# Patient Record
Sex: Female | Born: 1960 | Race: White | Hispanic: No | Marital: Married | Smoking: Never smoker
Health system: Southern US, Community
[De-identification: ages and names within clinical notes are randomized; demographics above are authoritative.]

## PROBLEM LIST (undated history)

## (undated) DIAGNOSIS — D473 Essential (hemorrhagic) thrombocythemia: Secondary | ICD-10-CM

## (undated) DIAGNOSIS — G43909 Migraine, unspecified, not intractable, without status migrainosus: Secondary | ICD-10-CM

## (undated) DIAGNOSIS — B019 Varicella without complication: Secondary | ICD-10-CM

## (undated) DIAGNOSIS — E785 Hyperlipidemia, unspecified: Secondary | ICD-10-CM

## (undated) DIAGNOSIS — R011 Cardiac murmur, unspecified: Secondary | ICD-10-CM

## (undated) DIAGNOSIS — Z780 Asymptomatic menopausal state: Secondary | ICD-10-CM

## (undated) DIAGNOSIS — K635 Polyp of colon: Secondary | ICD-10-CM

## (undated) DIAGNOSIS — J45909 Unspecified asthma, uncomplicated: Secondary | ICD-10-CM

## (undated) DIAGNOSIS — M1990 Unspecified osteoarthritis, unspecified site: Secondary | ICD-10-CM

## (undated) DIAGNOSIS — G510 Bell's palsy: Secondary | ICD-10-CM

## (undated) DIAGNOSIS — T7840XA Allergy, unspecified, initial encounter: Secondary | ICD-10-CM

## (undated) DIAGNOSIS — Z124 Encounter for screening for malignant neoplasm of cervix: Secondary | ICD-10-CM

## (undated) DIAGNOSIS — Z862 Personal history of diseases of the blood and blood-forming organs and certain disorders involving the immune mechanism: Secondary | ICD-10-CM

## (undated) DIAGNOSIS — R739 Hyperglycemia, unspecified: Secondary | ICD-10-CM

## (undated) DIAGNOSIS — B029 Zoster without complications: Secondary | ICD-10-CM

## (undated) HISTORY — PX: OTHER SURGICAL HISTORY: SHX169

## (undated) HISTORY — DX: Migraine, unspecified, not intractable, without status migrainosus: G43.909

## (undated) HISTORY — DX: Bell's palsy: G51.0

## (undated) HISTORY — DX: Cardiac murmur, unspecified: R01.1

## (undated) HISTORY — DX: Unspecified asthma, uncomplicated: J45.909

## (undated) HISTORY — DX: Polyp of colon: K63.5

## (undated) HISTORY — DX: Varicella without complication: B01.9

## (undated) HISTORY — DX: Allergy, unspecified, initial encounter: T78.40XA

## (undated) HISTORY — DX: Personal history of diseases of the blood and blood-forming organs and certain disorders involving the immune mechanism: Z86.2

## (undated) HISTORY — PX: TONSILLECTOMY: SHX5217

## (undated) HISTORY — PX: POLYPECTOMY: SHX149

## (undated) HISTORY — DX: Hyperlipidemia, unspecified: E78.5

## (undated) HISTORY — DX: Encounter for screening for malignant neoplasm of cervix: Z12.4

## (undated) HISTORY — DX: Unspecified osteoarthritis, unspecified site: M19.90

## (undated) HISTORY — PX: COLONOSCOPY: SHX174

## (undated) HISTORY — DX: Asymptomatic menopausal state: Z78.0

## (undated) HISTORY — DX: Essential (hemorrhagic) thrombocythemia: D47.3

## (undated) HISTORY — DX: Zoster without complications: B02.9

## (undated) HISTORY — DX: Hyperglycemia, unspecified: R73.9

---

## 1987-05-15 HISTORY — PX: DILATION AND CURETTAGE OF UTERUS: SHX78

## 2005-05-14 DIAGNOSIS — Z780 Asymptomatic menopausal state: Secondary | ICD-10-CM

## 2005-05-14 HISTORY — DX: Asymptomatic menopausal state: Z78.0

## 2011-01-13 LAB — HM PAP SMEAR: HM Pap smear: NORMAL

## 2011-09-13 ENCOUNTER — Ambulatory Visit (INDEPENDENT_AMBULATORY_CARE_PROVIDER_SITE_OTHER): Payer: BC Managed Care – PPO | Admitting: Family Medicine

## 2011-09-13 ENCOUNTER — Encounter: Payer: Self-pay | Admitting: Family Medicine

## 2011-09-13 VITALS — BP 125/79 | HR 80 | Temp 99.0°F | Ht 62.5 in | Wt 170.8 lb

## 2011-09-13 DIAGNOSIS — Z0000 Encounter for general adult medical examination without abnormal findings: Secondary | ICD-10-CM | POA: Insufficient documentation

## 2011-09-13 DIAGNOSIS — E559 Vitamin D deficiency, unspecified: Secondary | ICD-10-CM

## 2011-09-13 DIAGNOSIS — E785 Hyperlipidemia, unspecified: Secondary | ICD-10-CM | POA: Insufficient documentation

## 2011-09-13 DIAGNOSIS — R739 Hyperglycemia, unspecified: Secondary | ICD-10-CM

## 2011-09-13 DIAGNOSIS — B029 Zoster without complications: Secondary | ICD-10-CM | POA: Insufficient documentation

## 2011-09-13 DIAGNOSIS — Z1211 Encounter for screening for malignant neoplasm of colon: Secondary | ICD-10-CM

## 2011-09-13 DIAGNOSIS — G510 Bell's palsy: Secondary | ICD-10-CM | POA: Insufficient documentation

## 2011-09-13 DIAGNOSIS — Z780 Asymptomatic menopausal state: Secondary | ICD-10-CM | POA: Insufficient documentation

## 2011-09-13 DIAGNOSIS — Z23 Encounter for immunization: Secondary | ICD-10-CM

## 2011-09-13 DIAGNOSIS — G43909 Migraine, unspecified, not intractable, without status migrainosus: Secondary | ICD-10-CM | POA: Insufficient documentation

## 2011-09-13 DIAGNOSIS — L0100 Impetigo, unspecified: Secondary | ICD-10-CM

## 2011-09-13 DIAGNOSIS — D179 Benign lipomatous neoplasm, unspecified: Secondary | ICD-10-CM | POA: Insufficient documentation

## 2011-09-13 DIAGNOSIS — T7840XA Allergy, unspecified, initial encounter: Secondary | ICD-10-CM | POA: Insufficient documentation

## 2011-09-13 DIAGNOSIS — R7309 Other abnormal glucose: Secondary | ICD-10-CM

## 2011-09-13 NOTE — Assessment & Plan Note (Signed)
Enlarging over many years and becoming increasingly uncomfortable. Consider referral to [facility] for further consideration

## 2011-09-13 NOTE — Assessment & Plan Note (Signed)
Avoid trans fats, start Megared, minimize red neat

## 2011-09-13 NOTE — Assessment & Plan Note (Addendum)
Request old records, given Tdap today, referred for colonoscopy

## 2011-09-13 NOTE — Patient Instructions (Addendum)
Preventive Care for Adults, Female A healthy lifestyle and preventive care can promote health and wellness. Preventive health guidelines for women include the following key practices.  A routine yearly physical is a good way to check with your caregiver about your health and preventive screening. It is a chance to share any concerns and updates on your health, and to receive a thorough exam.   Visit your dentist for a routine exam and preventive care every 6 months. Brush your teeth twice a day and floss once a day. Good oral hygiene prevents tooth decay and gum disease.   The frequency of eye exams is based on your age, health, family medical history, use of contact lenses, and other factors. Follow your caregiver's recommendations for frequency of eye exams.   Eat a healthy diet. Foods like vegetables, fruits, whole grains, low-fat dairy products, and lean protein foods contain the nutrients you need without too many calories. Decrease your intake of foods high in solid fats, added sugars, and salt. Eat the right amount of calories for you.Get information about a proper diet from your caregiver, if necessary.   Regular physical exercise is one of the most important things you can do for your health. Most adults should get at least 150 minutes of moderate-intensity exercise (any activity that increases your heart rate and causes you to sweat) each week. In addition, most adults need muscle-strengthening exercises on 2 or more days a week.   Maintain a healthy weight. The body mass index (BMI) is a screening tool to identify possible weight problems. It provides an estimate of body fat based on height and weight. Your caregiver can help determine your BMI, and can help you achieve or maintain a healthy weight.For adults 20 years and older:   A BMI below 18.5 is considered underweight.   A BMI of 18.5 to 24.9 is normal.   A BMI of 25 to 29.9 is considered overweight.   A BMI of 30 and above is  considered obese.   Maintain normal blood lipids and cholesterol levels by exercising and minimizing your intake of saturated fat. Eat a balanced diet with plenty of fruit and vegetables. Blood tests for lipids and cholesterol should begin at age 20 and be repeated every 5 years. If your lipid or cholesterol levels are high, you are over 50, or you are at high risk for heart disease, you may need your cholesterol levels checked more frequently.Ongoing high lipid and cholesterol levels should be treated with medicines if diet and exercise are not effective.   If you smoke, find out from your caregiver how to quit. If you do not use tobacco, do not start.   If you are pregnant, do not drink alcohol. If you are breastfeeding, be very cautious about drinking alcohol. If you are not pregnant and choose to drink alcohol, do not exceed 1 drink per day. One drink is considered to be 12 ounces (355 mL) of beer, 5 ounces (148 mL) of wine, or 1.5 ounces (44 mL) of liquor.   Avoid use of street drugs. Do not share needles with anyone. Ask for help if you need support or instructions about stopping the use of drugs.   High blood pressure causes heart disease and increases the risk of stroke. Your blood pressure should be checked at least every 1 to 2 years. Ongoing high blood pressure should be treated with medicines if weight loss and exercise are not effective.   If you are 55 to 51   years old, ask your caregiver if you should take aspirin to prevent strokes.   Diabetes screening involves taking a blood sample to check your fasting blood sugar level. This should be done once every 3 years, after age 45, if you are within normal weight and without risk factors for diabetes. Testing should be considered at a younger age or be carried out more frequently if you are overweight and have at least 1 risk factor for diabetes.   Breast cancer screening is essential preventive care for women. You should practice "breast  self-awareness." This means understanding the normal appearance and feel of your breasts and may include breast self-examination. Any changes detected, no matter how small, should be reported to a caregiver. Women in their 20s and 30s should have a clinical breast exam (CBE) by a caregiver as part of a regular health exam every 1 to 3 years. After age 40, women should have a CBE every year. Starting at age 40, women should consider having a mammography (breast X-ray test) every year. Women who have a family history of breast cancer should talk to their caregiver about genetic screening. Women at a high risk of breast cancer should talk to their caregivers about having magnetic resonance imaging (MRI) and a mammography every year.   The Pap test is a screening test for cervical cancer. A Pap test can show cell changes on the cervix that might become cervical cancer if left untreated. A Pap test is a procedure in which cells are obtained and examined from the lower end of the uterus (cervix).   Women should have a Pap test starting at age 21.   Between ages 21 and 29, Pap tests should be repeated every 2 years.   Beginning at age 30, you should have a Pap test every 3 years as long as the past 3 Pap tests have been normal.   Some women have medical problems that increase the chance of getting cervical cancer. Talk to your caregiver about these problems. It is especially important to talk to your caregiver if a new problem develops soon after your last Pap test. In these cases, your caregiver may recommend more frequent screening and Pap tests.   The above recommendations are the same for women who have or have not gotten the vaccine for human papillomavirus (HPV).   If you had a hysterectomy for a problem that was not cancer or a condition that could lead to cancer, then you no longer need Pap tests. Even if you no longer need a Pap test, a regular exam is a good idea to make sure no other problems are  starting.   If you are between ages 65 and 70, and you have had normal Pap tests going back 10 years, you no longer need Pap tests. Even if you no longer need a Pap test, a regular exam is a good idea to make sure no other problems are starting.   If you have had past treatment for cervical cancer or a condition that could lead to cancer, you need Pap tests and screening for cancer for at least 20 years after your treatment.   If Pap tests have been discontinued, risk factors (such as a new sexual partner) need to be reassessed to determine if screening should be resumed.   The HPV test is an additional test that may be used for cervical cancer screening. The HPV test looks for the virus that can cause the cell changes on the cervix.   The cells collected during the Pap test can be tested for HPV. The HPV test could be used to screen women aged 30 years and older, and should be used in women of any age who have unclear Pap test results. After the age of 30, women should have HPV testing at the same frequency as a Pap test.   Colorectal cancer can be detected and often prevented. Most routine colorectal cancer screening begins at the age of 50 and continues through age 75. However, your caregiver may recommend screening at an earlier age if you have risk factors for colon cancer. On a yearly basis, your caregiver may provide home test kits to check for hidden blood in the stool. Use of a small camera at the end of a tube, to directly examine the colon (sigmoidoscopy or colonoscopy), can detect the earliest forms of colorectal cancer. Talk to your caregiver about this at age 50, when routine screening begins. Direct examination of the colon should be repeated every 5 to 10 years through age 75, unless early forms of pre-cancerous polyps or small growths are found.   Hepatitis C blood testing is recommended for all people born from 1945 through 1965 and any individual with known risks for hepatitis C.    Practice safe sex. Use condoms and avoid high-risk sexual practices to reduce the spread of sexually transmitted infections (STIs). STIs include gonorrhea, chlamydia, syphilis, trichomonas, herpes, HPV, and human immunodeficiency virus (HIV). Herpes, HIV, and HPV are viral illnesses that have no cure. They can result in disability, cancer, and death. Sexually active women aged 25 and younger should be checked for chlamydia. Older women with new or multiple partners should also be tested for chlamydia. Testing for other STIs is recommended if you are sexually active and at increased risk.   Osteoporosis is a disease in which the bones lose minerals and strength with aging. This can result in serious bone fractures. The risk of osteoporosis can be identified using a bone density scan. Women ages 65 and over and women at risk for fractures or osteoporosis should discuss screening with their caregivers. Ask your caregiver whether you should take a calcium supplement or vitamin D to reduce the rate of osteoporosis.   Menopause can be associated with physical symptoms and risks. Hormone replacement therapy is available to decrease symptoms and risks. You should talk to your caregiver about whether hormone replacement therapy is right for you.   Use sunscreen with sun protection factor (SPF) of 30 or more. Apply sunscreen liberally and repeatedly throughout the day. You should seek shade when your shadow is shorter than you. Protect yourself by wearing long sleeves, pants, a wide-brimmed hat, and sunglasses year round, whenever you are outdoors.   Once a month, do a whole body skin exam, using a mirror to look at the skin on your back. Notify your caregiver of new moles, moles that have irregular borders, moles that are larger than a pencil eraser, or moles that have changed in shape or color.   Stay current with required immunizations.   Influenza. You need a dose every fall (or winter). The composition of  the flu vaccine changes each year, so being vaccinated once is not enough.   Pneumococcal polysaccharide. You need 1 to 2 doses if you smoke cigarettes or if you have certain chronic medical conditions. You need 1 dose at age 65 (or older) if you have never been vaccinated.   Tetanus, diphtheria, pertussis (Tdap, Td). Get 1 dose of   Tdap vaccine if you are younger than age 65, are over 65 and have contact with an infant, are a healthcare worker, are pregnant, or simply want to be protected from whooping cough. After that, you need a Td booster dose every 10 years. Consult your caregiver if you have not had at least 3 tetanus and diphtheria-containing shots sometime in your life or have a deep or dirty wound.   HPV. You need this vaccine if you are a woman age 26 or younger. The vaccine is given in 3 doses over 6 months.   Measles, mumps, rubella (MMR). You need at least 1 dose of MMR if you were born in 1957 or later. You may also need a second dose.   Meningococcal. If you are age 19 to 21 and a first-year college student living in a residence hall, or have one of several medical conditions, you need to get vaccinated against meningococcal disease. You may also need additional booster doses.   Zoster (shingles). If you are age 60 or older, you should get this vaccine.   Varicella (chickenpox). If you have never had chickenpox or you were vaccinated but received only 1 dose, talk to your caregiver to find out if you need this vaccine.   Hepatitis A. You need this vaccine if you have a specific risk factor for hepatitis A virus infection or you simply wish to be protected from this disease. The vaccine is usually given as 2 doses, 6 to 18 months apart.   Hepatitis B. You need this vaccine if you have a specific risk factor for hepatitis B virus infection or you simply wish to be protected from this disease. The vaccine is given in 3 doses, usually over 6 months.  Preventive Services /  Frequency Ages 19 to 39  Blood pressure check.** / Every 1 to 2 years.   Lipid and cholesterol check.** / Every 5 years beginning at age 20.   Clinical breast exam.** / Every 3 years for women in their 20s and 30s.   Pap test.** / Every 2 years from ages 21 through 29. Every 3 years starting at age 30 through age 65 or 70 with a history of 3 consecutive normal Pap tests.   HPV screening.** / Every 3 years from ages 30 through ages 65 to 70 with a history of 3 consecutive normal Pap tests.   Hepatitis C blood test.** / For any individual with known risks for hepatitis C.   Skin self-exam. / Monthly.   Influenza immunization.** / Every year.   Pneumococcal polysaccharide immunization.** / 1 to 2 doses if you smoke cigarettes or if you have certain chronic medical conditions.   Tetanus, diphtheria, pertussis (Tdap, Td) immunization. / A one-time dose of Tdap vaccine. After that, you need a Td booster dose every 10 years.   HPV immunization. / 3 doses over 6 months, if you are 26 and younger.   Measles, mumps, rubella (MMR) immunization. / You need at least 1 dose of MMR if you were born in 1957 or later. You may also need a second dose.   Meningococcal immunization. / 1 dose if you are age 19 to 21 and a first-year college student living in a residence hall, or have one of several medical conditions, you need to get vaccinated against meningococcal disease. You may also need additional booster doses.   Varicella immunization.** / Consult your caregiver.   Hepatitis A immunization.** / Consult your caregiver. 2 doses, 6 to 18 months   apart.   Hepatitis B immunization.** / Consult your caregiver. 3 doses usually over 6 months.  Ages 40 to 64  Blood pressure check.** / Every 1 to 2 years.   Lipid and cholesterol check.** / Every 5 years beginning at age 20.   Clinical breast exam.** / Every year after age 40.   Mammogram.** / Every year beginning at age 40 and continuing for as  long as you are in good health. Consult with your caregiver.   Pap test.** / Every 3 years starting at age 30 through age 65 or 70 with a history of 3 consecutive normal Pap tests.   HPV screening.** / Every 3 years from ages 30 through ages 65 to 70 with a history of 3 consecutive normal Pap tests.   Fecal occult blood test (FOBT) of stool. / Every year beginning at age 50 and continuing until age 75. You may not need to do this test if you get a colonoscopy every 10 years.   Flexible sigmoidoscopy or colonoscopy.** / Every 5 years for a flexible sigmoidoscopy or every 10 years for a colonoscopy beginning at age 50 and continuing until age 75.   Hepatitis C blood test.** / For all people born from 1945 through 1965 and any individual with known risks for hepatitis C.   Skin self-exam. / Monthly.   Influenza immunization.** / Every year.   Pneumococcal polysaccharide immunization.** / 1 to 2 doses if you smoke cigarettes or if you have certain chronic medical conditions.   Tetanus, diphtheria, pertussis (Tdap, Td) immunization.** / A one-time dose of Tdap vaccine. After that, you need a Td booster dose every 10 years.   Measles, mumps, rubella (MMR) immunization. / You need at least 1 dose of MMR if you were born in 1957 or later. You may also need a second dose.   Varicella immunization.** / Consult your caregiver.   Meningococcal immunization.** / Consult your caregiver.   Hepatitis A immunization.** / Consult your caregiver. 2 doses, 6 to 18 months apart.   Hepatitis B immunization.** / Consult your caregiver. 3 doses, usually over 6 months.  Ages 65 and over  Blood pressure check.** / Every 1 to 2 years.   Lipid and cholesterol check.** / Every 5 years beginning at age 20.   Clinical breast exam.** / Every year after age 40.   Mammogram.** / Every year beginning at age 40 and continuing for as long as you are in good health. Consult with your caregiver.   Pap test.** /  Every 3 years starting at age 30 through age 65 or 70 with a 3 consecutive normal Pap tests. Testing can be stopped between 65 and 70 with 3 consecutive normal Pap tests and no abnormal Pap or HPV tests in the past 10 years.   HPV screening.** / Every 3 years from ages 30 through ages 65 or 70 with a history of 3 consecutive normal Pap tests. Testing can be stopped between 65 and 70 with 3 consecutive normal Pap tests and no abnormal Pap or HPV tests in the past 10 years.   Fecal occult blood test (FOBT) of stool. / Every year beginning at age 50 and continuing until age 75. You may not need to do this test if you get a colonoscopy every 10 years.   Flexible sigmoidoscopy or colonoscopy.** / Every 5 years for a flexible sigmoidoscopy or every 10 years for a colonoscopy beginning at age 50 and continuing until age 75.   Hepatitis   C blood test.** / For all people born from [birth year] through 1965 and any individual with known risks for hepatitis C.   Osteoporosis screening.** / A one-time screening for women ages 2 and over and women at risk for fractures or osteoporosis.   Skin self-exam. / Monthly.   Influenza immunization.** / Every year.   Pneumococcal polysaccharide immunization.** / 1 dose at age 60 (or older) if you have never been vaccinated.   Tetanus, diphtheria, pertussis (Tdap, Td) immunization. / A one-time dose of Tdap vaccine if you are over 65 and have contact with an infant, are a Research scientist (physical sciences), or simply want to be protected from whooping cough. After that, you need a Td booster dose every 10 years.   Varicella immunization.** / Consult your caregiver.   Meningococcal immunization.** / Consult your caregiver.   Hepatitis A immunization.** / Consult your caregiver. 2 doses, 6 to 18 months apart.   Hepatitis B immunization.** / Check with your caregiver. 3 doses, usually over 6 months.  ** Family history and personal history of risk and conditions may change your caregiver's  recommendations. Document Released: 06/26/2001 Document Revised: 04/19/2011 Document Reviewed: 09/25/2010 [facility] Patient Information 2012 [place], [place].   Start a megaRed cap daily  Hydrate well [employer]'s night time leg cramp med

## 2011-09-13 NOTE — Assessment & Plan Note (Signed)
Had trouble in her teen years and has had trouble intermittently but is now doing well, He will need further work up if any values

## 2011-09-13 NOTE — Assessment & Plan Note (Signed)
Will repeat levels to try and determine if she needs treatment

## 2011-09-13 NOTE — Assessment & Plan Note (Signed)
Has been told this in the past patient agrees to return for labs in near future

## 2011-09-13 NOTE — Assessment & Plan Note (Addendum)
Occurs on chin, none today

## 2011-09-13 NOTE — Progress Notes (Signed)
Patient ID: Sarah Velazquez, female   DOB: 29-Apr-1961, 51 y.o.   MRN: [id number] Shataria Crist [id number] 1961/02/23 09/13/2011      Progress Note-Follow Up  Subjective  Chief Complaint  Chief Complaint  Patient presents with  . Establish Care    new patient    HPI  Patient is a 51 year old Caucasian female who is in today for new patient appointment. In the past she has taken Crestor for hyperlipidemia but never had her cholesterol rechecked and so stopped it. She notes until age 51 from age of roughly to the pediatrician the time gave her gammaglobulin shots each week due to her history of recurrent bronchitis and hospitalization as a young child. As a child she had low-grade allergies has not had any significant wheezing or low-grade injury. No fevers, chills, malaise, myalgias, chest pain, palpitations, shortness of breath. Is requesting a referral for colonoscopy, no recent change in bowel habits.  Moves bowels comfortably. Does note ever since the birth of her 51 year old child which resulted in very painful and extensive episiotomy and repair, she has had intermittent episodes of rectal bleeding. She says they don't usually come with straining and they occur randomly. Less than once every 6 months. Blood is bright red and usually on the tissue very rarely and full. No abdominal pain nausea or anorexia  Past Medical History  Diagnosis Date  . Chicken pox as a child  . Mumps as a child  . Allergy     seasonal, dogs, cats, horses, hay  . Hyperlipidemia   . Bell's palsy in high school    right  . Shingles     right cheek  . Impetigo as a kid    right face  . Menopause 2007  . Migraines   . Vitamin d deficiency   . Hyperglycemia   . Lipoma     growing slowly since 20s    Past Surgical History  Procedure Date  . Cyst removed     twice on back and 1 on foot  . Cesarean section 05-10-87  . Tonsillectomy as a child  . Dilation and curettage of uterus 1989    incomplete miscarriage with  second pregnancy and again post menopausal    Family History  Problem Relation Age of Onset  . Stroke Mother     X 3  . Heart attack Mother   . Diabetes Mother     type 2  . Anxiety disorder Mother   . Hypertension Mother   . Hyperlipidemia Mother   . Heart disease Mother   . Cancer Mother     breast, skin  . Colon polyps Mother   . Stroke Father     several  . Heart attack Father     several  . Kidney disease Father     on dialysis  . Hypertension Father   . Hyperlipidemia Father   . Cancer Father     mouth X 2/ chewed tobacco, and skin  . Anxiety disorder Father   . Colon polyps Father   . Hypertension Brother   . Hyperlipidemia Brother   . Depression Brother   . Cancer Maternal Grandmother     breast  . Diabetes Maternal Grandmother     type 2  . Obesity Maternal Grandmother   . Cancer Paternal Grandmother     lung/ didn't smoke  . Heart attack Paternal Grandfather     History   Social History  . Marital Status: Married    Spouse  Name: N/A    Number of Children: N/A  . Years of Education: N/A   Occupational History  . Not on file.   Social History Main Topics  . Smoking status: Never Smoker   . Smokeless tobacco: Never Used  . Alcohol Use: Yes     occasionally  . Drug Use: No  . Sexually Active: Yes   Other Topics Concern  . Not on file   Social History Narrative  . No narrative on file    No current outpatient prescriptions on file prior to visit.    Allergies  Allergen Reactions  . Sulfa Antibiotics     Review of Systems  Review of Systems  Constitutional: Negative for fever and malaise/fatigue.  HENT: Negative for congestion.   Eyes: Negative for discharge.  Respiratory: Negative for shortness of breath.   Cardiovascular: Negative for chest pain, palpitations and leg swelling.  Gastrointestinal: Negative for heartburn, nausea, abdominal pain and diarrhea.  Genitourinary: Negative for dysuria.  Musculoskeletal: Negative for  falls.  Skin: Negative for rash.  Neurological: Negative for loss of consciousness and headaches.  Endo/Heme/Allergies: Negative for polydipsia.  Psychiatric/Behavioral: Negative for depression and suicidal ideas. The patient is not nervous/anxious and does not have insomnia.     Objective  BP 125/79  Pulse 80  Temp(Src) 99 F (37.2 C) (Temporal)  Ht 5' 2.5" (1.588 m)  Wt 170 lb 12.8 oz (77.474 kg)  BMI 30.74 kg/m2  SpO2 95%  Physical Physical Exam  Constitutional: She is oriented to person, place, and time and well-developed, well-nourished, and in no distress. No distress.  HENT:  Head: Normocephalic and atraumatic.  Right Ear: External ear normal.  Left Ear: External ear normal.  Nose: Nose normal.  Mouth/Throat: Oropharynx is clear and moist. No oropharyngeal exudate.  Eyes: Conjunctivae are normal. Pupils are equal, round, and reactive to light. Right eye exhibits no discharge. Left eye exhibits no discharge. No scleral icterus.  Neck: Normal range of motion. Neck supple. No thyromegaly present.  Cardiovascular: Normal rate, regular rhythm, normal heart sounds and intact distal pulses.   No murmur heard. Pulmonary/Chest: Effort normal and breath sounds normal. No respiratory distress. She has no wheezes. She has no rales.  Abdominal: Soft. Bowel sounds are normal. She exhibits no distension and no mass. There is no tenderness.  Musculoskeletal: Normal range of motion. She exhibits no edema and no tenderness.  Lymphadenopathy:    She has no cervical adenopathy.  Neurological: She is alert and oriented to person, place, and time. She has normal reflexes. No cranial nerve deficit. Coordination normal.  Skin: Skin is warm and dry. No rash noted. She is not diaphoretic.  Psychiatric: Mood, memory and affect normal.      Assessment & Plan   Lipoma Enlarging over many years and becoming increasingly uncomfortable. Consider referral to [facility] for further  consideration  Hyperglycemia Has been told this in the past patient agrees to return for labs in near future  Hyperlipidemia Avoid trans fats, start Megared, minimize red neat  Bell's palsy Had trouble in her teen years and has had trouble intermittently but is now doing well, He will need further work up if any values   Impetigo Occurs on chin, none today  Preventative health care Request old records, given Tdap today, referred for colonoscopy  Vitamin d deficiency Will repeat levels to try and determine if she needs treatment

## 2011-09-14 ENCOUNTER — Encounter: Payer: Self-pay | Admitting: Gastroenterology

## 2011-09-28 ENCOUNTER — Other Ambulatory Visit (INDEPENDENT_AMBULATORY_CARE_PROVIDER_SITE_OTHER): Payer: BC Managed Care – PPO

## 2011-09-28 DIAGNOSIS — E785 Hyperlipidemia, unspecified: Secondary | ICD-10-CM

## 2011-09-28 DIAGNOSIS — R739 Hyperglycemia, unspecified: Secondary | ICD-10-CM

## 2011-09-28 DIAGNOSIS — Z0000 Encounter for general adult medical examination without abnormal findings: Secondary | ICD-10-CM

## 2011-09-28 DIAGNOSIS — R7309 Other abnormal glucose: Secondary | ICD-10-CM

## 2011-09-28 LAB — CBC
HCT: 37.8 % (ref 36.0–46.0)
MCHC: 33.1 g/dL (ref 30.0–36.0)
MCV: 88 fl (ref 78.0–100.0)
RBC: 4.3 Mil/uL (ref 3.87–5.11)

## 2011-09-28 LAB — LIPID PANEL
Cholesterol: 251 mg/dL — ABNORMAL HIGH (ref 0–200)
HDL: 37.4 mg/dL — ABNORMAL LOW (ref >39.00)
Total CHOL/HDL Ratio: 7
Triglycerides: 266 mg/dL — ABNORMAL HIGH (ref 0.0–149.0)
VLDL: 53.2 mg/dL — ABNORMAL HIGH (ref 0.0–40.0)

## 2011-09-28 LAB — RENAL FUNCTION PANEL
Albumin: 4.2 g/dL (ref 3.5–5.2)
BUN: 15 mg/dL (ref 6–23)
Calcium: 9.4 mg/dL (ref 8.4–10.5)
Creatinine, Ser: 0.7 mg/dL (ref 0.4–1.2)
Glucose, Bld: 91 mg/dL (ref 70–99)
Phosphorus: 3.7 mg/dL (ref 2.3–4.6)

## 2011-09-28 LAB — HEPATIC FUNCTION PANEL
ALT: 20 U/L (ref 0–35)
Bilirubin, Direct: 0 mg/dL (ref 0.0–0.3)
Total Bilirubin: 0.5 mg/dL (ref 0.3–1.2)

## 2011-09-28 LAB — LDL CHOLESTEROL, DIRECT: Direct LDL: 170.5 mg/dL

## 2011-09-29 LAB — VITAMIN D 25 HYDROXY (VIT D DEFICIENCY, FRACTURES): Vit D, 25-Hydroxy: 37 ng/mL (ref 30–89)

## 2011-10-03 ENCOUNTER — Ambulatory Visit (AMBULATORY_SURGERY_CENTER): Payer: BC Managed Care – PPO | Admitting: *Deleted

## 2011-10-03 VITALS — Ht 62.0 in | Wt 168.7 lb

## 2011-10-03 DIAGNOSIS — Z1211 Encounter for screening for malignant neoplasm of colon: Secondary | ICD-10-CM

## 2011-10-03 MED ORDER — MOVIPREP 100 G PO SOLR: ORAL | Status: DC

## 2011-10-04 ENCOUNTER — Encounter: Payer: Self-pay | Admitting: Gastroenterology

## 2011-10-17 ENCOUNTER — Encounter: Payer: BC Managed Care – PPO | Admitting: Gastroenterology

## 2011-10-29 ENCOUNTER — Ambulatory Visit (AMBULATORY_SURGERY_CENTER): Payer: BC Managed Care – PPO | Admitting: Gastroenterology

## 2011-10-29 ENCOUNTER — Encounter: Payer: Self-pay | Admitting: Gastroenterology

## 2011-10-29 VITALS — BP 130/71 | HR 83 | Temp 95.6°F | Resp 16 | Ht 62.0 in | Wt 168.0 lb

## 2011-10-29 DIAGNOSIS — D126 Benign neoplasm of colon, unspecified: Secondary | ICD-10-CM

## 2011-10-29 DIAGNOSIS — Z1211 Encounter for screening for malignant neoplasm of colon: Secondary | ICD-10-CM

## 2011-10-29 MED ORDER — SODIUM CHLORIDE 0.9 % IV SOLN: 500.0000 mL | INTRAVENOUS | Status: DC

## 2011-10-29 NOTE — Patient Instructions (Addendum)
YOU HAD AN ENDOSCOPIC PROCEDURE TODAY AT THE [facility]: Refer to the procedure report that was given to you for any specific questions about what was found during the examination.  If the procedure report does not answer your questions, please call your gastroenterologist to clarify.  If you requested that your care partner not be given the details of your procedure findings, then the procedure report has been included in a sealed envelope for you to review at your convenience later.  YOU SHOULD EXPECT: Some feelings of bloating in the abdomen. Passage of more gas than usual.  Walking can help get rid of the air that was put into your GI tract during the procedure and reduce the bloating. If you had a lower endoscopy (such as a colonoscopy or flexible sigmoidoscopy) you may notice spotting of blood in your stool or on the toilet paper. If you underwent a bowel prep for your procedure, then you may not have a normal bowel movement for a few days.  DIET: Your first meal following the procedure should be a light meal and then it is ok to progress to your normal diet.  A half-sandwich or bowl of soup is an example of a good first meal.  Heavy or fried foods are harder to digest and may make you feel nauseous or bloated.  Likewise meals heavy in dairy and vegetables can cause extra gas to form and this can also increase the bloating.  Drink plenty of fluids but you should avoid alcoholic beverages for 24 hours.  ACTIVITY: Your care partner should take you home directly after the procedure.  You should plan to take it easy, moving slowly for the rest of the day.  You can resume normal activity the day after the procedure however you should NOT DRIVE or use heavy machinery for 24 hours (because of the sedation medicines used during the test).    SYMPTOMS TO REPORT IMMEDIATELY: A gastroenterologist can be reached at any hour.  During normal business hours, 8:30 AM to 5:00 PM Monday through Friday,  call [phone].  After hours and on weekends, please call the GI answering service at [phone] who will take a message and have the physician on call contact you.   Following lower endoscopy (colonoscopy or flexible sigmoidoscopy):  Excessive amounts of blood in the stool  Significant tenderness or worsening of abdominal pains  Swelling of the abdomen that is new, acute  Fever of 100F or higher    FOLLOW UP: If any biopsies were taken you will be contacted by phone or by letter within the next 1-3 weeks.  Call your gastroenterologist if you have not heard about the biopsies in 3 weeks.  Our staff will call the home number listed on your records the next business day following your procedure to check on you and address any questions or concerns that you may have at that time regarding the information given to you following your procedure. This is a courtesy call and so if there is no answer at the home number and we have not heard from you through the emergency physician on call, we will assume that you have returned to your regular daily activities without incident.  SIGNATURES/CONFIDENTIALITY: You and/or your care partner have signed paperwork which will be entered into your electronic medical record.  These signatures attest to the fact that that the information above on your After Visit Summary has been reviewed and is understood.  Full responsibility of the confidentiality   of this discharge information lies with you and/or your care-partner.     

## 2011-10-29 NOTE — Op Note (Signed)
[facility] [address] [place], [place]  [address]  COLONOSCOPY PROCEDURE REPORT  PATIENT:  Sarah Velazquez, Sarah Velazquez  MR#:  [id number] BIRTHDATE:  09-05-1960, 50 yrs. old  GENDER:  female ENDOSCOPIST:  Vania Rea. Jarold Motto, MD, [facility] REF. BY:  Reuel Derby, M.D. PROCEDURE DATE:  10/29/2011 PROCEDURE:  Colonoscopy with snare polypectomy ASA CLASS:  Class II INDICATIONS:  family Hx of polyps MEDICATIONS:   propofol (Diprivan) 350 mg IV  DESCRIPTION OF PROCEDURE:   After the risks and benefits and of the procedure were explained, informed consent was obtained. Digital rectal exam was performed and revealed no abnormalities. The LB CF-[id number] endoscope was introduced through the anus and advanced to the cecum, which was identified by both the appendix and ileocecal valve.  The quality of the prep was excellent, using MoviPrep.  The instrument was then slowly withdrawn as the colon was fully examined. <<PROCEDUREIMAGES>>  FINDINGS:  A sessile polyp was found at the splenic flexure. A 1.2 CM SESSILE FLAT POLYP HOT SNARE REMOVED AT SPLEENIC FLEXURE AREA.SEE PICTURES.  This was otherwise a normal examination of the colon.   Retroflexed views in the rectum revealed no abnormalities.    The scope was then withdrawn from the patient and the procedure completed.  COMPLICATIONS:  None ENDOSCOPIC IMPRESSION: 1) Sessile polyp at the splenic flexure 2) Otherwise normal examination R/O ADENOMA. RECOMMENDATIONS: 1) Await pathology results 2) Repeat colonoscopy in 5 years if polyp adenomatous; otherwise 10 years  REPEAT EXAM:  No  ______________________________ Vania Rea. Jarold Motto, MD, [facility]  CC:  n. eSIGNED:   Vania Rea. Deadra Diggins at 10/29/2011 03:41 PM  Earley Abide, [phone]

## 2011-10-29 NOTE — Progress Notes (Signed)
Patient did not experience any of the following events: a burn prior to discharge; a fall within the facility; wrong site/side/patient/procedure/implant event; or a hospital transfer or hospital admission upon discharge from the facility. (G8907) Patient did not have preoperative order for IV antibiotic SSI prophylaxis. (G8918)  

## 2011-10-30 ENCOUNTER — Telehealth: Payer: Self-pay | Admitting: *Deleted

## 2011-10-30 NOTE — Telephone Encounter (Signed)
  Follow up Call-  Call back number 10/29/2011  Post procedure Call Back phone  # [phone]  Permission to leave phone message Yes     Patient questions:  Do you have a fever, pain , or abdominal swelling? no Pain Score  0 *  Have you tolerated food without any problems? yes  Have you been able to return to your normal activities? yes  Do you have any questions about your discharge instructions: Diet   no Medications  no Follow up visit  no  Do you have questions or concerns about your Care? no  Actions: * If pain score is 4 or above: No action needed, pain <4.

## 2011-11-02 ENCOUNTER — Encounter: Payer: Self-pay | Admitting: Gastroenterology

## 2012-04-01 ENCOUNTER — Other Ambulatory Visit: Payer: Self-pay | Admitting: Family Medicine

## 2012-04-01 DIAGNOSIS — E559 Vitamin D deficiency, unspecified: Secondary | ICD-10-CM

## 2012-04-01 DIAGNOSIS — R51 Headache: Secondary | ICD-10-CM

## 2012-04-01 DIAGNOSIS — E785 Hyperlipidemia, unspecified: Secondary | ICD-10-CM

## 2012-04-02 ENCOUNTER — Other Ambulatory Visit (INDEPENDENT_AMBULATORY_CARE_PROVIDER_SITE_OTHER): Payer: BC Managed Care – PPO

## 2012-04-02 DIAGNOSIS — E559 Vitamin D deficiency, unspecified: Secondary | ICD-10-CM

## 2012-04-02 DIAGNOSIS — E785 Hyperlipidemia, unspecified: Secondary | ICD-10-CM

## 2012-04-02 DIAGNOSIS — R51 Headache: Secondary | ICD-10-CM

## 2012-04-02 LAB — RENAL FUNCTION PANEL
Albumin: 4.5 g/dL (ref 3.5–5.2)
BUN: 16 mg/dL (ref 6–23)
CO2: 27 mEq/L (ref 19–32)
Chloride: 104 mEq/L (ref 96–112)
Phosphorus: 4.2 mg/dL (ref 2.3–4.6)

## 2012-04-02 LAB — CBC
HCT: 41.3 % (ref 36.0–46.0)
Hemoglobin: 13.5 g/dL (ref 12.0–15.0)
RDW: 13 % (ref 11.5–14.6)
WBC: 5.7 10*3/uL (ref 4.5–10.5)

## 2012-04-02 LAB — HEPATIC FUNCTION PANEL
AST: 19 U/L (ref 0–37)
Albumin: 4.5 g/dL (ref 3.5–5.2)

## 2012-04-02 LAB — LIPID PANEL
Cholesterol: 273 mg/dL — ABNORMAL HIGH (ref 0–200)
Triglycerides: 313 mg/dL — ABNORMAL HIGH (ref 0.0–149.0)
VLDL: 62.6 mg/dL — ABNORMAL HIGH (ref 0.0–40.0)

## 2012-04-08 ENCOUNTER — Ambulatory Visit (INDEPENDENT_AMBULATORY_CARE_PROVIDER_SITE_OTHER): Payer: BC Managed Care – PPO | Admitting: Family Medicine

## 2012-04-08 ENCOUNTER — Other Ambulatory Visit (HOSPITAL_COMMUNITY)
Admission: RE | Admit: 2012-04-08 | Discharge: 2012-04-08 | Disposition: A | Discharge status: Home / Self Care | Payer: BC Managed Care – PPO | Source: Ambulatory Visit | Attending: Family Medicine | Admitting: Family Medicine

## 2012-04-08 ENCOUNTER — Encounter: Payer: Self-pay | Admitting: Family Medicine

## 2012-04-08 VITALS — BP 122/78 | HR 67 | Temp 98.4°F | Ht 62.0 in | Wt 167.4 lb

## 2012-04-08 DIAGNOSIS — Z124 Encounter for screening for malignant neoplasm of cervix: Secondary | ICD-10-CM | POA: Insufficient documentation

## 2012-04-08 DIAGNOSIS — D126 Benign neoplasm of colon, unspecified: Secondary | ICD-10-CM

## 2012-04-08 DIAGNOSIS — D473 Essential (hemorrhagic) thrombocythemia: Secondary | ICD-10-CM

## 2012-04-08 DIAGNOSIS — K635 Polyp of colon: Secondary | ICD-10-CM

## 2012-04-08 DIAGNOSIS — Z0000 Encounter for general adult medical examination without abnormal findings: Secondary | ICD-10-CM

## 2012-04-08 DIAGNOSIS — Z01419 Encounter for gynecological examination (general) (routine) without abnormal findings: Secondary | ICD-10-CM | POA: Insufficient documentation

## 2012-04-08 DIAGNOSIS — E785 Hyperlipidemia, unspecified: Secondary | ICD-10-CM

## 2012-04-08 DIAGNOSIS — E559 Vitamin D deficiency, unspecified: Secondary | ICD-10-CM

## 2012-04-08 HISTORY — DX: Encounter for screening for malignant neoplasm of cervix: Z12.4

## 2012-04-08 NOTE — Assessment & Plan Note (Addendum)
Pap smear done today. No concerns identified on exam. Order given for [facility] and bone densitometry, she has historically done these tests at [facility] so she is given a printed copy of the orders to carry with her.

## 2012-04-08 NOTE — Patient Instructions (Addendum)

## 2012-04-09 ENCOUNTER — Encounter: Payer: Self-pay | Admitting: Family Medicine

## 2012-04-09 DIAGNOSIS — K635 Polyp of colon: Secondary | ICD-10-CM

## 2012-04-09 DIAGNOSIS — D75839 Thrombocytosis, unspecified: Secondary | ICD-10-CM

## 2012-04-09 DIAGNOSIS — D473 Essential (hemorrhagic) thrombocythemia: Secondary | ICD-10-CM | POA: Insufficient documentation

## 2012-04-09 HISTORY — DX: Polyp of colon: K63.5

## 2012-04-09 HISTORY — DX: Thrombocytosis, unspecified: D75.839

## 2012-04-09 NOTE — Assessment & Plan Note (Signed)
Numbers have worsened. She is not eating or drinking appropriately. Has stopped exercising and stopped taking her may arise well. She agrees to restart Omega red and she increased her exercise minimize saturated fats and carbs and avoid. Reassess in 3-4 months. May need to consider medications at that time. Encouraged to consider a vitamin B complex as well.

## 2012-04-09 NOTE — Assessment & Plan Note (Signed)
Levels within normal now

## 2012-04-09 NOTE — Assessment & Plan Note (Signed)
One found, recommended repeat colonoscopy in 5 years.

## 2012-04-09 NOTE — Assessment & Plan Note (Signed)
Very slight, new, likely secondary to dehydration.

## 2012-04-09 NOTE — Progress Notes (Signed)
Patient ID: Sarah Velazquez, female   DOB: Sep 20, 1960, 51 y.o.   MRN: [id number] Sarah Velazquez [id number] Aug 29, 1960 04/09/2012      Progress Note-Follow Up  Subjective  Chief Complaint  Chief Complaint  Patient presents with  . Gynecologic Exam    pap    HPI  Patient is a 51 year old Caucasian female who is in today for Pap smear and followup. Overall she's feeling well. She is frustrated about increasing her cholesterol. She technologist that her for a mother who's been sick for the last 3 months and as result she stopped exercising, is not eating right and is now taking her supplements. No other acute complaints. No recent illness, fevers, headaches, chest pain, palpitations, shortness of breath, GI or GU concerns. She denies any vaginal discharge lesions or concerns. Last period was December of 2012. She did have her colonoscopy in June of 2018 and 1 hyperplastic polyp was found. No constipation or concerns are noted.  Past Medical History  Diagnosis Date  . Chicken pox as a child  . Mumps as a child  . Allergy     seasonal, dogs, cats, horses, hay  . Hyperlipidemia   . Bell's palsy in high school    right  . Shingles     right cheek  . Impetigo as a kid    right face  . Menopause 2007  . Migraines   . Vitamin D deficiency   . Hyperglycemia   . Lipoma     growing slowly since 20s  . Preventative health care 09/13/2011  . Cervical cancer screening 04/08/2012  . Thrombocytosis 04/09/2012  . Hyperplastic colon polyp 04/09/2012    Past Surgical History  Procedure Date  . Cyst removed     twice on back and 1 on foot  . Cesarean section 05-10-87  . Tonsillectomy as a child  . Dilation and curettage of uterus 1989    incomplete miscarriage with second pregnancy and again post menopausal    Family History  Problem Relation Age of Onset  . Stroke Mother     X 3  . Heart attack Mother   . Diabetes Mother     type 2  . Anxiety disorder Mother   . Hypertension Mother   .  Hyperlipidemia Mother   . Heart disease Mother   . Cancer Mother     breast, skin  . Colon polyps Mother   . Stroke Father     several  . Heart attack Father     several  . Kidney disease Father     on dialysis  . Hypertension Father   . Hyperlipidemia Father   . Cancer Father     mouth X 2/ chewed tobacco, and skin  . Anxiety disorder Father   . Colon polyps Father   . Hypertension Brother   . Hyperlipidemia Brother   . Depression Brother   . Cancer Maternal Grandmother     breast  . Diabetes Maternal Grandmother     type 2  . Obesity Maternal Grandmother   . Cancer Paternal Grandmother     lung/ didn't smoke  . Heart attack Paternal Grandfather     History   Social History  . Marital Status: Married    Spouse Name: N/A    Number of Children: N/A  . Years of Education: N/A   Occupational History  . Not on file.   Social History Main Topics  . Smoking status: Never Smoker   . Smokeless tobacco: Never  Used  . Alcohol Use: 1.8 oz/week    3 Glasses of wine per week     Comment: occasionally  . Drug Use: No  . Sexually Active: Yes   Other Topics Concern  . Not on file   Social History Narrative  . No narrative on file    No current outpatient prescriptions on file prior to visit.    Allergies  Allergen Reactions  . Sulfa Antibiotics     Review of Systems  Review of Systems  Constitutional: Negative for fever and malaise/fatigue.  HENT: Negative for congestion.   Eyes: Negative for discharge.  Respiratory: Negative for shortness of breath.   Cardiovascular: Negative for chest pain, palpitations and leg swelling.  Gastrointestinal: Negative for nausea, abdominal pain and diarrhea.  Genitourinary: Negative for dysuria.  Musculoskeletal: Negative for falls.  Skin: Negative for rash.  Neurological: Negative for loss of consciousness and headaches.  Endo/Heme/Allergies: Negative for polydipsia.  Psychiatric/Behavioral: Negative for depression and  suicidal ideas. The patient is not nervous/anxious and does not have insomnia.     Objective  BP 122/78  Pulse 67  Temp 98.4 F (36.9 C) (Temporal)  Ht 5\' 2"  (1.575 m)  Wt 167 lb 6.4 oz (75.932 kg)  BMI 30.62 kg/m2  SpO2 99%  Physical Exam  Physical Exam  Constitutional: She is oriented to person, place, and time and well-developed, well-nourished, and in no distress. No distress.  HENT:  Head: Normocephalic and atraumatic.  Right Ear: External ear normal.  Left Ear: External ear normal.  Nose: Nose normal.  Mouth/Throat: Oropharynx is clear and moist. No oropharyngeal exudate.  Eyes: Conjunctivae normal are normal. Pupils are equal, round, and reactive to light. Right eye exhibits no discharge. Left eye exhibits no discharge. No scleral icterus.  Neck: Normal range of motion. Neck supple. No thyromegaly present.  Cardiovascular: Normal rate, regular rhythm, normal heart sounds and intact distal pulses.   No murmur heard. Pulmonary/Chest: Effort normal and breath sounds normal. No respiratory distress. She has no wheezes. She has no rales.  Abdominal: Soft. Bowel sounds are normal. She exhibits no distension and no mass. There is no tenderness.  Genitourinary: Vagina normal, uterus normal, cervix normal, right adnexa normal and left adnexa normal. No vaginal discharge found.  Musculoskeletal: Normal range of motion. She exhibits no edema and no tenderness.  Lymphadenopathy:    She has no cervical adenopathy.  Neurological: She is alert and oriented to person, place, and time. She has normal reflexes. No cranial nerve deficit. Coordination normal.  Skin: Skin is warm and dry. No rash noted. She is not diaphoretic.  Psychiatric: Mood, memory and affect normal.    Lab Results  Component Value Date   TSH 1.63 04/02/2012   Lab Results  Component Value Date   WBC 5.7 04/02/2012   HGB 13.5 04/02/2012   HCT 41.3 04/02/2012   MCV 87.4 04/02/2012   PLT 408.0* 04/02/2012   Lab  Results  Component Value Date   CREATININE 0.8 04/02/2012   BUN 16 04/02/2012   NA 139 04/02/2012   K 4.1 04/02/2012   CL 104 04/02/2012   CO2 27 04/02/2012   Lab Results  Component Value Date   ALT 18 04/02/2012   AST 19 04/02/2012   ALKPHOS 56 04/02/2012   BILITOT 0.5 04/02/2012   Lab Results  Component Value Date   CHOL 273* 04/02/2012   Lab Results  Component Value Date   HDL 33.60* 04/02/2012   No results found for  this basename: [facility]   Lab Results  Component Value Date   TRIG 313.0* 04/02/2012   Lab Results  Component Value Date   CHOLHDL 8 04/02/2012     Assessment & Plan  Cervical cancer screening Pap smear done today. No concerns identified on exam. Order given for [facility] and bone densitometry, she has historically done these tests at [facility] so she is given a printed copy of the orders to carry with her.  Hyperlipidemia Numbers have worsened. She is not eating or drinking appropriately. Has stopped exercising and stopped taking her may arise well. She agrees to restart Omega red and she increased her exercise minimize saturated fats and carbs and avoid. Reassess in 3-4 months. May need to consider medications at that time. Encouraged to consider a vitamin B complex as well.  Vitamin D deficiency Levels within normal now  Thrombocytosis Very slight, new, likely secondary to dehydration.   Preventative health care Had colonoscopy recently 1 polyp found repeat in 5 years. MGM and bone density ordered today  Hyperplastic colon polyp One found, recommended repeat colonoscopy in 5 years.

## 2012-04-09 NOTE — Assessment & Plan Note (Signed)
Had colonoscopy recently 1 polyp found repeat in 5 years. MGM and bone density ordered today

## 2012-04-11 ENCOUNTER — Other Ambulatory Visit: Payer: Self-pay | Admitting: Family Medicine

## 2012-04-16 ENCOUNTER — Telehealth: Payer: Self-pay

## 2012-04-16 NOTE — Telephone Encounter (Signed)
Left a detailed message stating that her bone density report was normal

## 2012-08-12 ENCOUNTER — Other Ambulatory Visit (INDEPENDENT_AMBULATORY_CARE_PROVIDER_SITE_OTHER): Payer: BC Managed Care – PPO

## 2012-08-12 DIAGNOSIS — E785 Hyperlipidemia, unspecified: Secondary | ICD-10-CM

## 2012-08-12 LAB — HEPATIC FUNCTION PANEL
ALT: 18 U/L (ref 0–35)
Total Bilirubin: 0.3 mg/dL (ref 0.3–1.2)
Total Protein: 7.4 g/dL (ref 6.0–8.3)

## 2012-08-12 LAB — RENAL FUNCTION PANEL
Calcium: 9.2 mg/dL (ref 8.4–10.5)
Glucose, Bld: 94 mg/dL (ref 70–99)
Phosphorus: 4.2 mg/dL (ref 2.3–4.6)
Potassium: 4 mEq/L (ref 3.5–5.1)
Sodium: 137 mEq/L (ref 135–145)

## 2012-08-12 LAB — LIPID PANEL
HDL: 30.8 mg/dL — ABNORMAL LOW (ref >39.00)
Triglycerides: 181 mg/dL — ABNORMAL HIGH (ref 0.0–149.0)

## 2012-08-12 LAB — CBC
RBC: 4.48 Mil/uL (ref 3.87–5.11)
WBC: 5.6 10*3/uL (ref 4.5–10.5)

## 2012-08-12 NOTE — Progress Notes (Signed)
Labs only

## 2012-08-13 LAB — LDL CHOLESTEROL, DIRECT: Direct LDL: 147.9 mg/dL

## 2012-08-27 ENCOUNTER — Ambulatory Visit (INDEPENDENT_AMBULATORY_CARE_PROVIDER_SITE_OTHER): Payer: BC Managed Care – PPO | Admitting: Family Medicine

## 2012-08-27 ENCOUNTER — Encounter: Payer: Self-pay | Admitting: Family Medicine

## 2012-08-27 VITALS — BP 117/81 | HR 85 | Temp 98.0°F | Ht 62.0 in | Wt 160.1 lb

## 2012-08-27 DIAGNOSIS — G43909 Migraine, unspecified, not intractable, without status migrainosus: Secondary | ICD-10-CM

## 2012-08-27 DIAGNOSIS — Z5189 Encounter for other specified aftercare: Secondary | ICD-10-CM

## 2012-08-27 DIAGNOSIS — L578 Other skin changes due to chronic exposure to nonionizing radiation: Secondary | ICD-10-CM

## 2012-08-27 DIAGNOSIS — T7840XD Allergy, unspecified, subsequent encounter: Secondary | ICD-10-CM

## 2012-08-27 DIAGNOSIS — E559 Vitamin D deficiency, unspecified: Secondary | ICD-10-CM

## 2012-08-27 DIAGNOSIS — E785 Hyperlipidemia, unspecified: Secondary | ICD-10-CM

## 2012-08-27 NOTE — Patient Instructions (Addendum)

## 2012-08-27 NOTE — Assessment & Plan Note (Signed)
Patient does not c/o HA today

## 2012-08-31 ENCOUNTER — Encounter: Payer: Self-pay | Admitting: Family Medicine

## 2012-08-31 NOTE — Assessment & Plan Note (Signed)
Greatly improved with nearly vegetarian diet. Encouraged same with increased exercise and fatty acid supplements.

## 2012-08-31 NOTE — Progress Notes (Signed)
Patient ID: Sarah Velazquez, female   DOB: 1961-02-20, 52 y.o.   MRN: [id number] Sarah Velazquez [id number] [birth year] 08/31/2012      Progress Note-Follow Up  Subjective  Chief Complaint  Chief Complaint  Patient presents with  . Follow-up    3 month    HPI  Patient is a 52 yo female in today for follow up. Is feeling well. NO recent illness, cp, palp, sob, gi or gu c/o. Is following a vegetarian diet most of the week, excercising regularly. No flare in allergies or migraine headaches.   Past Medical History  Diagnosis Date  . Chicken pox as a child  . Mumps as a child  . Allergy     seasonal, dogs, cats, horses, hay  . Hyperlipidemia   . Bell's palsy in high school    right  . Shingles     right cheek  . Impetigo as a kid    right face  . Menopause 2007  . Migraines   . Vitamin D deficiency   . Hyperglycemia   . Lipoma     growing slowly since 20s  . Preventative health care 09/13/2011  . Cervical cancer screening 04/08/2012  . Thrombocytosis 04/09/2012  . Hyperplastic colon polyp 04/09/2012    Past Surgical History  Procedure Laterality Date  . Cyst removed      twice on back and 1 on foot  . Cesarean section  05-10-87  . Tonsillectomy  as a child  . Dilation and curettage of uterus  1989    incomplete miscarriage with second pregnancy and again post menopausal    Family History  Problem Relation Age of Onset  . Stroke Mother     X 3  . Heart attack Mother   . Diabetes Mother     type 2  . Anxiety disorder Mother   . Hypertension Mother   . Hyperlipidemia Mother   . Heart disease Mother   . Cancer Mother     breast, skin  . Colon polyps Mother   . Stroke Father     several  . Heart attack Father     several  . Kidney disease Father     on dialysis  . Hypertension Father   . Hyperlipidemia Father   . Cancer Father     mouth X 2/ chewed tobacco, and skin  . Anxiety disorder Father   . Colon polyps Father   . Hypertension Brother   . Hyperlipidemia  Brother   . Depression Brother   . Cancer Maternal Grandmother     breast  . Diabetes Maternal Grandmother     type 2  . Obesity Maternal Grandmother   . Cancer Paternal Grandmother     lung/ didn't smoke  . Heart attack Paternal Grandfather     History   Social History  . Marital Status: Married    Spouse Name: N/A    Number of Children: N/A  . Years of Education: N/A   Occupational History  . Not on file.   Social History Main Topics  . Smoking status: Never Smoker   . Smokeless tobacco: Never Used  . Alcohol Use: 1.8 oz/week    3 Glasses of wine per week     Comment: occasionally  . Drug Use: No  . Sexually Active: Yes   Other Topics Concern  . Not on file   Social History Narrative  . No narrative on file    Current Outpatient Prescriptions on File Prior to  Visit  Medication Sig Dispense Refill  . loratadine-pseudoephedrine (CLARITIN-D 24-HOUR) 10-240 MG per 24 hr tablet Take 1 tablet by mouth daily as needed.       No current facility-administered medications on file prior to visit.    Allergies  Allergen Reactions  . Sulfa Antibiotics     Review of Systems  Review of Systems  Constitutional: Negative for fever and malaise/fatigue.  HENT: Negative for congestion.   Eyes: Negative for discharge.  Respiratory: Negative for shortness of breath.   Cardiovascular: Negative for chest pain, palpitations and leg swelling.  Gastrointestinal: Negative for nausea, abdominal pain and diarrhea.  Genitourinary: Negative for dysuria.  Musculoskeletal: Negative for falls.  Skin: Negative for rash.  Neurological: Negative for loss of consciousness and headaches.  Endo/Heme/Allergies: Negative for polydipsia.  Psychiatric/Behavioral: Negative for depression and suicidal ideas. The patient is not nervous/anxious and does not have insomnia.     Objective  BP 117/81  Pulse 85  Temp(Src) 98 F (36.7 C) (Temporal)  Ht 5\' 2"  (1.575 m)  Wt 160 lb 1.9 oz (72.63  kg)  BMI 29.28 kg/m2  SpO2 97%  Physical Exam  Physical Exam  Constitutional: She is oriented to person, place, and time and well-developed, well-nourished, and in no distress. No distress.  HENT:  Head: Normocephalic and atraumatic.  Eyes: Conjunctivae are normal.  Neck: Neck supple. No thyromegaly present.  Cardiovascular: Normal rate, regular rhythm and normal heart sounds.   No murmur heard. Pulmonary/Chest: Effort normal and breath sounds normal. She has no wheezes.  Abdominal: She exhibits no distension and no mass.  Musculoskeletal: She exhibits no edema.  Lymphadenopathy:    She has no cervical adenopathy.  Neurological: She is alert and oriented to person, place, and time.  Skin: Skin is warm and dry. No rash noted. She is not diaphoretic.  Psychiatric: Memory, affect and judgment normal.    Lab Results  Component Value Date   TSH 1.63 04/02/2012   Lab Results  Component Value Date   WBC 5.6 08/12/2012   HGB 12.7 08/12/2012   HCT 37.5 08/12/2012   MCV 83.8 08/12/2012   PLT 353.0 08/12/2012   Lab Results  Component Value Date   CREATININE 0.8 08/12/2012   BUN 11 08/12/2012   NA 137 08/12/2012   K 4.0 08/12/2012   CL 103 08/12/2012   CO2 26 08/12/2012   Lab Results  Component Value Date   ALT 18 08/12/2012   AST 18 08/12/2012   ALKPHOS 54 08/12/2012   BILITOT 0.3 08/12/2012   Lab Results  Component Value Date   CHOL 210* 08/12/2012   Lab Results  Component Value Date   HDL 30.80* 08/12/2012   No results found for this basename: [facility]   Lab Results  Component Value Date   TRIG 181.0* 08/12/2012   Lab Results  Component Value Date   CHOLHDL 7 08/12/2012     Assessment & Plan  Migraines Patient does not c/o HA today  Hyperlipidemia Greatly improved with nearly vegetarian diet. Encouraged same with increased exercise and fatty acid supplements.  Allergy Uses OTC antihistamines prn with good results.

## 2012-08-31 NOTE — Assessment & Plan Note (Signed)
Uses OTC antihistamines prn with good results.

## 2013-04-21 LAB — HM MAMMOGRAPHY

## 2014-03-01 ENCOUNTER — Ambulatory Visit (HOSPITAL_BASED_OUTPATIENT_CLINIC_OR_DEPARTMENT_OTHER)
Admission: RE | Admit: 2014-03-01 | Discharge: 2014-03-01 | Disposition: A | Discharge status: Home / Self Care | Payer: BC Managed Care – PPO | Source: Ambulatory Visit | Attending: Family Medicine | Admitting: Family Medicine

## 2014-03-01 ENCOUNTER — Ambulatory Visit (INDEPENDENT_AMBULATORY_CARE_PROVIDER_SITE_OTHER): Payer: BC Managed Care – PPO | Admitting: Family Medicine

## 2014-03-01 ENCOUNTER — Encounter: Payer: Self-pay | Admitting: Family Medicine

## 2014-03-01 VITALS — BP 125/58 | HR 66 | Temp 97.6°F | Ht 62.0 in | Wt 161.6 lb

## 2014-03-01 DIAGNOSIS — M25562 Pain in left knee: Secondary | ICD-10-CM | POA: Diagnosis not present

## 2014-03-01 DIAGNOSIS — N649 Disorder of breast, unspecified: Secondary | ICD-10-CM | POA: Diagnosis not present

## 2014-03-01 DIAGNOSIS — E559 Vitamin D deficiency, unspecified: Secondary | ICD-10-CM

## 2014-03-01 DIAGNOSIS — E785 Hyperlipidemia, unspecified: Secondary | ICD-10-CM | POA: Diagnosis not present

## 2014-03-01 DIAGNOSIS — Z0000 Encounter for general adult medical examination without abnormal findings: Secondary | ICD-10-CM | POA: Diagnosis not present

## 2014-03-01 DIAGNOSIS — T732XXA Exhaustion due to exposure, initial encounter: Secondary | ICD-10-CM | POA: Diagnosis not present

## 2014-03-01 DIAGNOSIS — M7989 Other specified soft tissue disorders: Secondary | ICD-10-CM | POA: Insufficient documentation

## 2014-03-01 DIAGNOSIS — M25561 Pain in right knee: Secondary | ICD-10-CM | POA: Diagnosis not present

## 2014-03-01 DIAGNOSIS — J029 Acute pharyngitis, unspecified: Secondary | ICD-10-CM

## 2014-03-01 LAB — RENAL FUNCTION PANEL
ALBUMIN: 3.9 g/dL (ref 3.5–5.2)
BUN: 11 mg/dL (ref 6–23)
CALCIUM: 9.6 mg/dL (ref 8.4–10.5)
CO2: 26 mEq/L (ref 19–32)
CREATININE: 0.7 mg/dL (ref 0.4–1.2)
Chloride: 108 mEq/L (ref 96–112)
GFR: 91.44 mL/min (ref >60.00)
GLUCOSE: 105 mg/dL — AB (ref 70–99)
PHOSPHORUS: 3.1 mg/dL (ref 2.3–4.6)
POTASSIUM: 5.1 meq/L (ref 3.5–5.1)
Sodium: 144 mEq/L (ref 135–145)

## 2014-03-01 LAB — LIPID PANEL
CHOL/HDL RATIO: 8
Cholesterol: 254 mg/dL — ABNORMAL HIGH (ref 0–200)
HDL: 30.9 mg/dL — ABNORMAL LOW (ref >39.00)
NONHDL: 223.1
Triglycerides: 426 mg/dL — ABNORMAL HIGH (ref 0.0–149.0)
VLDL: 85.2 mg/dL — ABNORMAL HIGH (ref 0.0–40.0)

## 2014-03-01 LAB — HEPATIC FUNCTION PANEL
ALK PHOS: 61 U/L (ref 39–117)
ALT: 20 U/L (ref 0–35)
AST: 20 U/L (ref 0–37)
Albumin: 3.9 g/dL (ref 3.5–5.2)
BILIRUBIN TOTAL: 0.6 mg/dL (ref 0.2–1.2)
Bilirubin, Direct: 0 mg/dL (ref 0.0–0.3)
Total Protein: 7.8 g/dL (ref 6.0–8.3)

## 2014-03-01 LAB — CBC
HEMATOCRIT: 40.5 % (ref 36.0–46.0)
HEMOGLOBIN: 13.4 g/dL (ref 12.0–15.0)
MCHC: 33.1 g/dL (ref 30.0–36.0)
MCV: 86.9 fl (ref 78.0–100.0)
PLATELETS: 418 10*3/uL — AB (ref 150.0–400.0)
RBC: 4.66 Mil/uL (ref 3.87–5.11)
RDW: 13.5 % (ref 11.5–15.5)
WBC: 7.2 10*3/uL (ref 4.0–10.5)

## 2014-03-01 LAB — LDL CHOLESTEROL, DIRECT: LDL DIRECT: 147.5 mg/dL

## 2014-03-01 LAB — VITAMIN D 25 HYDROXY (VIT D DEFICIENCY, FRACTURES): VITD: 26.5 ng/mL — ABNORMAL LOW (ref 30.00–100.00)

## 2014-03-01 LAB — TSH: TSH: 1.14 u[IU]/mL (ref 0.35–4.50)

## 2014-03-01 NOTE — Progress Notes (Signed)
Pre visit review using our clinic review tool, if applicable. No additional management support is needed unless otherwise documented below in the visit note. 

## 2014-03-01 NOTE — Patient Instructions (Signed)
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 Preventive health guidelines for women include the following key practices.  A routine yearly physical is a good way to check with your health care provider about your health and preventive screening. It is a chance to share any concerns and updates on your health and to receive a thorough exam.  Visit your dentist for a routine exam and preventive care every 6 months. Brush your teeth twice a day and floss once a day. Good oral hygiene prevents tooth decay and gum disease.  The frequency of eye exams is based on your age, health, family medical history, use of contact lenses, and other factors. Follow your health care provider's recommendations for frequency of eye exams.  Eat a healthy diet. Foods like vegetables, fruits, whole grains, low-fat dairy products, and lean protein foods contain the nutrients you need without too many calories. Decrease your intake of foods high in solid fats, added sugars, and salt. Eat the right amount of calories for you.Get information about a proper diet from your health care provider, if necessary.  Regular physical exercise is one of the most important things you can do for your health. Most adults should get at least 150 minutes of moderate-intensity exercise (any activity that increases your heart rate and causes you to sweat) each week. In addition, most adults need muscle-strengthening exercises on 2 or more days a week.  Maintain a healthy weight. The body mass index (BMI) is a screening tool to identify possible weight problems. It provides an estimate of body fat based on height and weight. Your health care provider can find your BMI and can help you achieve or maintain a healthy  weight.For adults 20 years and older:  A BMI below 18.5 is considered underweight.  A BMI of 18.5 to 24.9 is normal.  A BMI of 25 to 29.9 is considered overweight.  A BMI of 30 and above is considered obese.  Maintain normal blood lipids and cholesterol levels by exercising and minimizing your intake of saturated fat. Eat a balanced diet with plenty of fruit and vegetables. Blood tests for lipids and cholesterol should begin at age 50 and be repeated every 5 years. If your lipid or cholesterol levels are high, you are over 50, or you are at high risk for heart disease, you may need your cholesterol levels checked more frequently.Ongoing high lipid and cholesterol levels should be treated with medicines if diet and exercise are not working.  If you smoke, find out from your health care provider how to quit. If you do not use tobacco, do not start.  Lung cancer screening is recommended for adults aged 8-80 years who are at high risk for developing lung cancer because of a history of smoking. A yearly low-dose CT scan of the lungs is recommended for people who have at least a 30-pack-year history of smoking and are a current smoker or have quit within the past 15 years. A pack year of smoking is smoking an average of 1 pack of cigarettes a day for 1 year (for example: 1 pack a day for 30 years or 2 packs a day for 15 years). Yearly screening should continue until the smoker has stopped smoking for at least 15 years. Yearly  screening should be stopped for people who develop a health problem that would prevent them from having lung cancer treatment.  If you are pregnant, do not drink alcohol. If you are breastfeeding, be very cautious about drinking alcohol. If you are not pregnant and choose to drink alcohol, do not have more than 1 drink per day. One drink is considered to be 12 ounces (355 mL) of beer, 5 ounces (148 mL) of wine, or 1.5 ounces (44 mL) of liquor.  Avoid use of street drugs. Do not  share needles with anyone. Ask for help if you need support or instructions about stopping the use of drugs.  High blood pressure causes heart disease and increases the risk of stroke. Your blood pressure should be checked at least every 1 to 2 years. Ongoing high blood pressure should be treated with medicines if weight loss and exercise do not work.  If you are 69-33 years old, ask your health care provider if you should take aspirin to prevent strokes.  Diabetes screening involves taking a blood sample to check your fasting blood sugar level. This should be done once every 3 years, after age 34, if you are within normal weight and without risk factors for diabetes. Testing should be considered at a younger age or be carried out more frequently if you are overweight and have at least 1 risk factor for diabetes.  Breast cancer screening is essential preventive care for women. You should practice "breast self-awareness." This means understanding the normal appearance and feel of your breasts and may include breast self-examination. Any changes detected, no matter how small, should be reported to a health care provider. Women in their 34s and 30s should have a clinical breast exam (CBE) by a health care provider as part of a regular health exam every 1 to 3 years. After age 78, women should have a CBE every year. Starting at age 91, women should consider having a mammogram (breast X-ray test) every year. Women who have a family history of breast cancer should talk to their health care provider about genetic screening. Women at a high risk of breast cancer should talk to their health care providers about having an MRI and a mammogram every year.  Breast cancer gene (BRCA)-related cancer risk assessment is recommended for women who have family members with BRCA-related cancers. BRCA-related cancers include breast, ovarian, tubal, and peritoneal cancers. Having family members with these cancers may be  associated with an increased risk for harmful changes (mutations) in the breast cancer genes BRCA1 and BRCA2. Results of the assessment will determine the need for genetic counseling and BRCA1 and BRCA2 testing.  Routine pelvic exams to screen for cancer are no longer recommended for nonpregnant women who are considered low risk for cancer of the pelvic organs (ovaries, uterus, and vagina) and who do not have symptoms. Ask your health care provider if a screening pelvic exam is right for you.  If you have had past treatment for cervical cancer or a condition that could lead to cancer, you need Pap tests and screening for cancer for at least 20 years after your treatment. If Pap tests have been discontinued, your risk factors (such as having a new sexual partner) need to be reassessed to determine if screening should be resumed. Some women have medical problems that increase the chance of getting cervical cancer. In these cases, your health care provider may recommend more frequent screening and Pap tests.  The HPV test is an additional test  that may be used for cervical cancer screening. The HPV test looks for the virus that can cause the cell changes on the cervix. The cells collected during the Pap test can be tested for HPV. The HPV test could be used to screen women aged 92 years and older, and should be used in women of any age who have unclear Pap test results. After the age of 21, women should have HPV testing at the same frequency as a Pap test.  Colorectal cancer can be detected and often prevented. Most routine colorectal cancer screening begins at the age of 39 years and continues through age 25 years. However, your health care provider may recommend screening at an earlier age if you have risk factors for colon cancer. On a yearly basis, your health care provider may provide home test kits to check for hidden blood in the stool. Use of a small camera at the end of a tube, to directly examine the  colon (sigmoidoscopy or colonoscopy), can detect the earliest forms of colorectal cancer. Talk to your health care provider about this at age 47, when routine screening begins. Direct exam of the colon should be repeated every 5-10 years through age 13 years, unless early forms of pre-cancerous polyps or small growths are found.  People who are at an increased risk for hepatitis B should be screened for this virus. You are considered at high risk for hepatitis B if:  You were born in a country where hepatitis B occurs often. Talk with your health care provider about which countries are considered high risk.  Your parents were born in a high-risk country and you have not received a shot to protect against hepatitis B (hepatitis B vaccine).  You have HIV or AIDS.  You use needles to inject street drugs.  You live with, or have sex with, someone who has hepatitis B.  You get hemodialysis treatment.  You take certain medicines for conditions like cancer, organ transplantation, and autoimmune conditions.  Hepatitis C blood testing is recommended for all people born from [birth year] through 1965 and any individual with known risks for hepatitis C.  Practice safe sex. Use condoms and avoid high-risk sexual practices to reduce the spread of sexually transmitted infections (STIs). STIs include gonorrhea, chlamydia, syphilis, trichomonas, herpes, HPV, and human immunodeficiency virus (HIV). Herpes, HIV, and HPV are viral illnesses that have no cure. They can result in disability, cancer, and death.  You should be screened for sexually transmitted illnesses (STIs) including gonorrhea and chlamydia if:  You are sexually active and are younger than 24 years.  You are older than 24 years and your health care provider tells you that you are at risk for this type of infection.  Your sexual activity has changed since you were last screened and you are at an increased risk for chlamydia or gonorrhea. Ask your  health care provider if you are at risk.  If you are at risk of being infected with HIV, it is recommended that you take a prescription medicine daily to prevent HIV infection. This is called preexposure prophylaxis (PrEP). You are considered at risk if:  You are a heterosexual woman, are sexually active, and are at increased risk for HIV infection.  You take drugs by injection.  You are sexually active with a partner who has HIV.  Talk with your health care provider about whether you are at high risk of being infected with HIV. If you choose to begin PrEP, you should first  be tested for HIV. You should then be tested every 3 months for as long as you are taking PrEP.  Osteoporosis is a disease in which the bones lose minerals and strength with aging. This can result in serious bone fractures or breaks. The risk of osteoporosis can be identified using a bone density scan. Women ages 65 years and over and women at risk for fractures or osteoporosis should discuss screening with their health care providers. Ask your health care provider whether you should take a calcium supplement or vitamin D to reduce the rate of osteoporosis.  Menopause can be associated with physical symptoms and risks. Hormone replacement therapy is available to decrease symptoms and risks. You should talk to your health care provider about whether hormone replacement therapy is right for you.  Use sunscreen. Apply sunscreen liberally and repeatedly throughout the day. You should seek shade when your shadow is shorter than you. Protect yourself by wearing long sleeves, pants, a wide-brimmed hat, and sunglasses year round, whenever you are outdoors.  Once a month, do a whole body skin exam, using a mirror to look at the skin on your back. Tell your health care provider of new moles, moles that have irregular borders, moles that are larger than a pencil eraser, or moles that have changed in shape or color.  Stay current with  required vaccines (immunizations).  Influenza vaccine. All adults should be immunized every year.  Tetanus, diphtheria, and acellular pertussis (Td, Tdap) vaccine. Pregnant women should receive 1 dose of Tdap vaccine during each pregnancy. The dose should be obtained regardless of the length of time since the last dose. Immunization is preferred during the 27th-36th week of gestation. An adult who has not previously received Tdap or who does not know her vaccine status should receive 1 dose of Tdap. This initial dose should be followed by tetanus and diphtheria toxoids (Td) booster doses every 10 years. Adults with an unknown or incomplete history of completing a 3-dose immunization series with Td-containing vaccines should begin or complete a primary immunization series including a Tdap dose. Adults should receive a Td booster every 10 years.  Varicella vaccine. An adult without evidence of immunity to varicella should receive 2 doses or a second dose if she has previously received 1 dose. Pregnant females who do not have evidence of immunity should receive the first dose after pregnancy. This first dose should be obtained before leaving the health care facility. The second dose should be obtained 4-8 weeks after the first dose.  Human papillomavirus (HPV) vaccine. Females aged 13-26 years who have not received the vaccine previously should obtain the 3-dose series. The vaccine is not recommended for use in pregnant females. However, pregnancy testing is not needed before receiving a dose. If a female is found to be pregnant after receiving a dose, no treatment is needed. In that case, the remaining doses should be delayed until after the pregnancy. Immunization is recommended for any person with an immunocompromised condition through the age of 76 years if she did not get any or all doses earlier. During the 3-dose series, the second dose should be obtained 4-8 weeks after the first dose. The third dose  should be obtained 24 weeks after the first dose and 16 weeks after the second dose.  Zoster vaccine. One dose is recommended for adults aged 18 years or older unless certain conditions are present.  Measles, mumps, and rubella (MMR) vaccine. Adults born before [birth year] generally are considered immune to measles  and mumps. Adults born in [birth year] or later should have 1 or more doses of MMR vaccine unless there is a contraindication to the vaccine or there is laboratory evidence of immunity to each of the three diseases. A routine second dose of MMR vaccine should be obtained at least 28 days after the first dose for students attending postsecondary schools, health care workers, or international travelers. People who received inactivated measles vaccine or an unknown type of measles vaccine during 1963-1967 should receive 2 doses of MMR vaccine. People who received inactivated mumps vaccine or an unknown type of mumps vaccine before 1979 and are at high risk for mumps infection should consider immunization with 2 doses of MMR vaccine. For females of childbearing age, rubella immunity should be determined. If there is no evidence of immunity, females who are not pregnant should be vaccinated. If there is no evidence of immunity, females who are pregnant should delay immunization until after pregnancy. Unvaccinated health care workers born before [birth year] who lack laboratory evidence of measles, mumps, or rubella immunity or laboratory confirmation of disease should consider measles and mumps immunization with 2 doses of MMR vaccine or rubella immunization with 1 dose of MMR vaccine.  Pneumococcal 13-valent conjugate (PCV13) vaccine. When indicated, a person who is uncertain of her immunization history and has no record of immunization should receive the PCV13 vaccine. An adult aged 59 years or older who has certain medical conditions and has not been previously immunized should receive 1 dose of PCV13 vaccine. This PCV13  should be followed with a dose of pneumococcal polysaccharide (PPSV23) vaccine. The PPSV23 vaccine dose should be obtained at least 8 weeks after the dose of PCV13 vaccine. An adult aged 48 years or older who has certain medical conditions and previously received 1 or more doses of PPSV23 vaccine should receive 1 dose of PCV13. The PCV13 vaccine dose should be obtained 1 or more years after the last PPSV23 vaccine dose.  Pneumococcal polysaccharide (PPSV23) vaccine. When PCV13 is also indicated, PCV13 should be obtained first. All adults aged 2 years and older should be immunized. An adult younger than age 110 years who has certain medical conditions should be immunized. Any person who resides in a nursing home or long-term care facility should be immunized. An adult smoker should be immunized. People with an immunocompromised condition and certain other conditions should receive both PCV13 and PPSV23 vaccines. People with human immunodeficiency virus (HIV) infection should be immunized as soon as possible after diagnosis. Immunization during chemotherapy or radiation therapy should be avoided. Routine use of PPSV23 vaccine is not recommended for American Indians, [place] Natives, or people younger than 65 years unless there are medical conditions that require PPSV23 vaccine. When indicated, people who have unknown immunization and have no record of immunization should receive PPSV23 vaccine. One-time revaccination 5 years after the first dose of PPSV23 is recommended for people aged 19-64 years who have chronic kidney failure, nephrotic syndrome, asplenia, or immunocompromised conditions. People who received 1-2 doses of PPSV23 before age 85 years should receive another dose of PPSV23 vaccine at age 80 years or later if at least 5 years have passed since the previous dose. Doses of PPSV23 are not needed for people immunized with PPSV23 at or after age 8 years.  Meningococcal vaccine. Adults with asplenia or  persistent complement component deficiencies should receive 2 doses of quadrivalent meningococcal conjugate (MenACWY-D) vaccine. The doses should be obtained at least 2 months apart. Microbiologists working with certain meningococcal bacteria,  [employer], people at risk during an outbreak, and people who travel to or live in countries with a high rate of meningitis should be immunized. A first-year college student up through age 39 years who is living in a residence hall should receive a dose if she did not receive a dose on or after her 16th birthday. Adults who have certain high-risk conditions should receive one or more doses of vaccine.  Hepatitis A vaccine. Adults who wish to be protected from this disease, have certain high-risk conditions, work with hepatitis A-infected animals, work in hepatitis A research labs, or travel to or work in countries with a high rate of hepatitis A should be immunized. Adults who were previously unvaccinated and who anticipate close contact with an international adoptee during the first 60 days after arrival in the [place] from a country with a high rate of hepatitis A should be immunized.  Hepatitis B vaccine. Adults who wish to be protected from this disease, have certain high-risk conditions, may be exposed to blood or other infectious body fluids, are household contacts or sex partners of hepatitis B positive people, are clients or workers in certain care facilities, or travel to or work in countries with a high rate of hepatitis B should be immunized.  Haemophilus influenzae type b (Hib) vaccine. A previously unvaccinated person with asplenia or sickle cell disease or having a scheduled splenectomy should receive 1 dose of Hib vaccine. Regardless of previous immunization, a recipient of a hematopoietic stem cell transplant should receive a 3-dose series 6-12 months after her successful transplant. Hib vaccine is not recommended for adults with HIV  infection. Preventive Services / Frequency Ages 66 to 6 years  Blood pressure check.** / Every 1 to 2 years.  Lipid and cholesterol check.** / Every 5 years beginning at age 61.  Clinical breast exam.** / Every 3 years for women in their 13s and 53s.  BRCA-related cancer risk assessment.** / For women who have family members with a BRCA-related cancer (breast, ovarian, tubal, or peritoneal cancers).  Pap test.** / Every 2 years from ages 57 through 66. Every 3 years starting at age 63 through age 59 or 69 with a history of 3 consecutive normal Pap tests.  HPV screening.** / Every 3 years from ages 59 through ages 39 to 62 with a history of 3 consecutive normal Pap tests.  Hepatitis C blood test.** / For any individual with known risks for hepatitis C.  Skin self-exam. / Monthly.  Influenza vaccine. / Every year.  Tetanus, diphtheria, and acellular pertussis (Tdap, Td) vaccine.** / Consult your health care provider. Pregnant women should receive 1 dose of Tdap vaccine during each pregnancy. 1 dose of Td every 10 years.  Varicella vaccine.** / Consult your health care provider. Pregnant females who do not have evidence of immunity should receive the first dose after pregnancy.  HPV vaccine. / 3 doses over 6 months, if 100 and younger. The vaccine is not recommended for use in pregnant females. However, pregnancy testing is not needed before receiving a dose.  Measles, mumps, rubella (MMR) vaccine.** / You need at least 1 dose of MMR if you were born in 1957 or later. You may also need a 2nd dose. For females of childbearing age, rubella immunity should be determined. If there is no evidence of immunity, females who are not pregnant should be vaccinated. If there is no evidence of immunity, females who are pregnant should delay immunization until after pregnancy.  Pneumococcal  13-valent conjugate (PCV13) vaccine.** / Consult your health care provider.  Pneumococcal polysaccharide  (PPSV23) vaccine.** / 1 to 2 doses if you smoke cigarettes or if you have certain conditions.  Meningococcal vaccine.** / 1 dose if you are age 46 to 20 years and a Market researcher living in a residence hall, or have one of several medical conditions, you need to get vaccinated against meningococcal disease. You may also need additional booster doses.  Hepatitis A vaccine.** / Consult your health care provider.  Hepatitis B vaccine.** / Consult your health care provider.  Haemophilus influenzae type b (Hib) vaccine.** / Consult your health care provider. Ages 46 to 44 years  Blood pressure check.** / Every 1 to 2 years.  Lipid and cholesterol check.** / Every 5 years beginning at age 35 years.  Lung cancer screening. / Every year if you are aged 34-80 years and have a 30-pack-year history of smoking and currently smoke or have quit within the past 15 years. Yearly screening is stopped once you have quit smoking for at least 15 years or develop a health problem that would prevent you from having lung cancer treatment.  Clinical breast exam.** / Every year after age 38 years.  BRCA-related cancer risk assessment.** / For women who have family members with a BRCA-related cancer (breast, ovarian, tubal, or peritoneal cancers).  Mammogram.** / Every year beginning at age 66 years and continuing for as long as you are in good health. Consult with your health care provider.  Pap test.** / Every 3 years starting at age 56 years through age 42 or 52 years with a history of 3 consecutive normal Pap tests.  HPV screening.** / Every 3 years from ages 38 years through ages 50 to 66 years with a history of 3 consecutive normal Pap tests.  Fecal occult blood test (FOBT) of stool. / Every year beginning at age 29 years and continuing until age 71 years. You may not need to do this test if you get a colonoscopy every 10 years.  Flexible sigmoidoscopy or colonoscopy.** / Every 5 years for a  flexible sigmoidoscopy or every 10 years for a colonoscopy beginning at age 32 years and continuing until age 5 years.  Hepatitis C blood test.** / For all people born from [birth year] through 1965 and any individual with known risks for hepatitis C.  Skin self-exam. / Monthly.  Influenza vaccine. / Every year.  Tetanus, diphtheria, and acellular pertussis (Tdap/Td) vaccine.** / Consult your health care provider. Pregnant women should receive 1 dose of Tdap vaccine during each pregnancy. 1 dose of Td every 10 years.  Varicella vaccine.** / Consult your health care provider. Pregnant females who do not have evidence of immunity should receive the first dose after pregnancy.  Zoster vaccine.** / 1 dose for adults aged 83 years or older.  Measles, mumps, rubella (MMR) vaccine.** / You need at least 1 dose of MMR if you were born in 1957 or later. You may also need a 2nd dose. For females of childbearing age, rubella immunity should be determined. If there is no evidence of immunity, females who are not pregnant should be vaccinated. If there is no evidence of immunity, females who are pregnant should delay immunization until after pregnancy.  Pneumococcal 13-valent conjugate (PCV13) vaccine.** / Consult your health care provider.  Pneumococcal polysaccharide (PPSV23) vaccine.** / 1 to 2 doses if you smoke cigarettes or if you have certain conditions.  Meningococcal vaccine.** / Consult your health care provider.  Hepatitis A vaccine.** / Consult your health care provider.  Hepatitis B vaccine.** / Consult your health care provider.  Haemophilus influenzae type b (Hib) vaccine.** / Consult your health care provider. Ages 33 years and over  Blood pressure check.** / Every 1 to 2 years.  Lipid and cholesterol check.** / Every 5 years beginning at age 41 years.  Lung cancer screening. / Every year if you are aged 34-80 years and have a 30-pack-year history of smoking and currently smoke or have  quit within the past 15 years. Yearly screening is stopped once you have quit smoking for at least 15 years or develop a health problem that would prevent you from having lung cancer treatment.  Clinical breast exam.** / Every year after age 38 years.  BRCA-related cancer risk assessment.** / For women who have family members with a BRCA-related cancer (breast, ovarian, tubal, or peritoneal cancers).  Mammogram.** / Every year beginning at age 73 years and continuing for as long as you are in good health. Consult with your health care provider.  Pap test.** / Every 3 years starting at age 69 years through age 35 or 35 years with 3 consecutive normal Pap tests. Testing can be stopped between 65 and 70 years with 3 consecutive normal Pap tests and no abnormal Pap or HPV tests in the past 10 years.  HPV screening.** / Every 3 years from ages 61 years through ages 46 or 52 years with a history of 3 consecutive normal Pap tests. Testing can be stopped between 65 and 70 years with 3 consecutive normal Pap tests and no abnormal Pap or HPV tests in the past 10 years.  Fecal occult blood test (FOBT) of stool. / Every year beginning at age 19 years and continuing until age 54 years. You may not need to do this test if you get a colonoscopy every 10 years.  Flexible sigmoidoscopy or colonoscopy.** / Every 5 years for a flexible sigmoidoscopy or every 10 years for a colonoscopy beginning at age 104 years and continuing until age 3 years.  Hepatitis C blood test.** / For all people born from [birth year] through 1965 and any individual with known risks for hepatitis C.  Osteoporosis screening.** / A one-time screening for women ages 72 years and over and women at risk for fractures or osteoporosis.  Skin self-exam. / Monthly.  Influenza vaccine. / Every year.  Tetanus, diphtheria, and acellular pertussis (Tdap/Td) vaccine.** / 1 dose of Td every 10 years.  Varicella vaccine.** / Consult your health care  provider.  Zoster vaccine.** / 1 dose for adults aged 75 years or older.  Pneumococcal 13-valent conjugate (PCV13) vaccine.** / Consult your health care provider.  Pneumococcal polysaccharide (PPSV23) vaccine.** / 1 dose for all adults aged 36 years and older.  Meningococcal vaccine.** / Consult your health care provider.  Hepatitis A vaccine.** / Consult your health care provider.  Hepatitis B vaccine.** / Consult your health care provider.  Haemophilus influenzae type b (Hib) vaccine.** / Consult your health care provider. ** Family history and personal history of risk and conditions may change your health care provider's recommendations. Document Released: 06/26/2001 Document Revised: 09/14/2013 Document Reviewed: 09/25/2010 [facility] Patient Information 2015 [place], [place]. This information is not intended to replace advice given to you by your health care provider. Make sure you discuss any questions you have with your health care provider.

## 2014-03-01 NOTE — Progress Notes (Signed)
Patient ID: Sarah Velazquez, female   DOB: [birth year], 53 y.o.   MRN: [id number] Sarah Velazquez [id number] 15-Jul-1960 03/01/2014      Progress Note-Follow Up  Subjective  Chief Complaint  Chief Complaint  Patient presents with  . Annual Exam    physical/ fasting    HPI  Patient is a 53 year old female in today for routine medical care. In today for annual exam. C/o increased pain and stiffness in b/l knees. Has no swelling or redness but notes stiffness and trouble doing her yoga as a result. No injury. Is noting increased swelling in right axillae but no other breast c/o. No discharge or skin changes. Has been noting some pain in right breast intermittent and only lasts 30 seconds. No other acute c/o. Denies CP/palp/SOB/HA/congestion/fevers/GI or GU c/o. Taking meds as prescribed  Past Medical History  Diagnosis Date  . Chicken pox as a child  . Mumps as a child  . Allergy     seasonal, dogs, cats, horses, hay  . Hyperlipidemia   . Bell's palsy in high school    right  . Shingles     right cheek  . Impetigo as a kid    right face  . Menopause 2007  . Migraines   . Vitamin D deficiency   . Hyperglycemia   . Lipoma     growing slowly since 20s  . Preventative health care 09/13/2011  . Cervical cancer screening 04/08/2012  . Thrombocytosis 04/09/2012  . Hyperplastic colon polyp 04/09/2012    Past Surgical History  Procedure Laterality Date  . Cyst removed      twice on back and 1 on foot  . Cesarean section  05-10-87  . Tonsillectomy  as a child  . Dilation and curettage of uterus  1989    incomplete miscarriage with second pregnancy and again post menopausal    Family History  Problem Relation Age of Onset  . Stroke Mother     X 3  . Heart attack Mother   . Diabetes Mother     type 2  . Anxiety disorder Mother   . Hypertension Mother   . Hyperlipidemia Mother   . Heart disease Mother   . Cancer Mother     breast, skin  . Colon polyps Mother   . Stroke Father      several  . Heart attack Father     several  . Kidney disease Father     on dialysis  . Hypertension Father   . Hyperlipidemia Father   . Cancer Father     mouth X 2/ chewed tobacco, and skin  . Anxiety disorder Father   . Colon polyps Father   . Hypertension Brother   . Hyperlipidemia Brother   . Depression Brother   . Cancer Maternal Grandmother     breast  . Diabetes Maternal Grandmother     type 2  . Obesity Maternal Grandmother   . Cancer Paternal Grandmother     lung/ didn't smoke  . Heart attack Paternal Grandfather     History   Social History  . Marital Status: Married    Spouse Name: N/A    Number of Children: N/A  . Years of Education: N/A   Occupational History  . Not on file.   Social History Main Topics  . Smoking status: Never Smoker   . Smokeless tobacco: Never Used  . Alcohol Use: 1.8 oz/week    3 Glasses of wine per week  Comment: occasionally  . Drug Use: No  . Sexual Activity: Yes   Other Topics Concern  . Not on file   Social History Narrative  . No narrative on file    No current outpatient prescriptions on file prior to visit.   No current facility-administered medications on file prior to visit.    Allergies  Allergen Reactions  . Sulfa Antibiotics     Review of Systems  Review of Systems  Constitutional: Negative for fever, chills and malaise/fatigue.  HENT: Negative for congestion, hearing loss and nosebleeds.   Eyes: Negative for discharge.  Respiratory: Negative for cough, sputum production, shortness of breath and wheezing.   Cardiovascular: Negative for chest pain, palpitations and leg swelling.  Gastrointestinal: Negative for heartburn, nausea, vomiting, abdominal pain, diarrhea, constipation and blood in stool.  Genitourinary: Negative for dysuria, urgency, frequency and hematuria.  Musculoskeletal: Negative for back pain, falls and myalgias.  Skin: Negative for rash.  Neurological: Negative for dizziness,  tremors, sensory change, focal weakness, loss of consciousness, weakness and headaches.  Endo/Heme/Allergies: Negative for polydipsia. Does not bruise/bleed easily.  Psychiatric/Behavioral: Negative for depression and suicidal ideas. The patient is not nervous/anxious and does not have insomnia.     Objective  BP 125/58  Pulse 66  Temp(Src) 97.6 F (36.4 C) (Oral)  Ht 5\' 2"  (1.575 m)  Wt 161 lb 9.6 oz (73.301 kg)  BMI 29.55 kg/m2  SpO2 100%  Physical Exam  Physical Exam  Constitutional: She is oriented to person, place, and time and well-developed, well-nourished, and in no distress. No distress.  HENT:  Head: Normocephalic and atraumatic.  Right Ear: External ear normal.  Left Ear: External ear normal.  Nose: Nose normal.  Mouth/Throat: Oropharynx is clear and moist. No oropharyngeal exudate.  Eyes: Conjunctivae are normal. Pupils are equal, round, and reactive to light. Right eye exhibits no discharge. Left eye exhibits no discharge. No scleral icterus.  Neck: Normal range of motion. Neck supple. No thyromegaly present.  Cardiovascular: Normal rate, regular rhythm, normal heart sounds and intact distal pulses.   No murmur heard. Pulmonary/Chest: Effort normal and breath sounds normal. No respiratory distress. She has no wheezes. She has no rales.  Abdominal: Soft. Bowel sounds are normal. She exhibits no distension and no mass. There is no tenderness.  Genitourinary:  1x2 cm lesion at 10 oclock in right breast  Musculoskeletal: Normal range of motion. She exhibits no edema and no tenderness.  Lymphadenopathy:    She has no cervical adenopathy.  Neurological: She is alert and oriented to person, place, and time. She has normal reflexes. No cranial nerve deficit. Coordination normal.  Skin: Skin is warm and dry. No rash noted. She is not diaphoretic.  Psychiatric: Mood, memory and affect normal.    Lab Results  Component Value Date   TSH 1.63 04/02/2012   Lab Results   Component Value Date   WBC 5.6 08/12/2012   HGB 12.7 08/12/2012   HCT 37.5 08/12/2012   MCV 83.8 08/12/2012   PLT 353.0 08/12/2012   Lab Results  Component Value Date   CREATININE 0.8 08/12/2012   BUN 11 08/12/2012   NA 137 08/12/2012   K 4.0 08/12/2012   CL 103 08/12/2012   CO2 26 08/12/2012   Lab Results  Component Value Date   ALT 18 08/12/2012   AST 18 08/12/2012   ALKPHOS 54 08/12/2012   BILITOT 0.3 08/12/2012   Lab Results  Component Value Date   CHOL 210* 08/12/2012  Lab Results  Component Value Date   HDL 30.80* 08/12/2012   No results found for this basename: [facility]   Lab Results  Component Value Date   TRIG 181.0* 08/12/2012   Lab Results  Component Value Date   CHOLHDL 7 08/12/2012     Assessment & Plan   Hyperlipidemia Tolerating statin, encouraged heart healthy diet, avoid trans fats, minimize simple carbs and saturated fats. Increase exercise as tolerated  Vitamin D deficiency Increase Vitamin D supplements   Preventative health care Patient encouraged to maintain heart healthy diet, regular exercise, adequate sleep. Consider daily probiotics. Take medications as prescribed. Declines flu shot. Pap due in 2016. MGM ordered today. Colonoscopy in 2013  Breast lesion 1x2 cm lesion at 10 oclock of right breast referred to diagnostic MGM  Arthralgia of both knees xrays obtained and PT ordered. Consider ortho referral

## 2014-03-02 ENCOUNTER — Encounter: Payer: Self-pay | Admitting: Family Medicine

## 2014-03-02 ENCOUNTER — Other Ambulatory Visit: Payer: Self-pay | Admitting: Family Medicine

## 2014-03-02 DIAGNOSIS — M25562 Pain in left knee: Principal | ICD-10-CM

## 2014-03-02 DIAGNOSIS — M25561 Pain in right knee: Secondary | ICD-10-CM

## 2014-03-02 LAB — EPSTEIN-BARR VIRUS VCA ANTIBODY PANEL
EBV EA IgG: 91.3 U/mL — ABNORMAL HIGH (ref <9.0)
EBV NA IgG: 180 U/mL — ABNORMAL HIGH (ref <18.0)
EBV VCA IGG: 338 U/mL — AB (ref <18.0)

## 2014-03-02 MED ORDER — ATORVASTATIN CALCIUM 10 MG PO TABS
10.0000 mg | ORAL_TABLET | Freq: Every day | ORAL | Status: DC
Start: 2014-03-02 — End: 2015-08-03

## 2014-03-05 ENCOUNTER — Other Ambulatory Visit: Payer: Self-pay | Admitting: Family Medicine

## 2014-03-05 DIAGNOSIS — N649 Disorder of breast, unspecified: Secondary | ICD-10-CM

## 2014-03-07 DIAGNOSIS — M25562 Pain in left knee: Secondary | ICD-10-CM

## 2014-03-07 DIAGNOSIS — N649 Disorder of breast, unspecified: Secondary | ICD-10-CM | POA: Insufficient documentation

## 2014-03-07 DIAGNOSIS — M25561 Pain in right knee: Secondary | ICD-10-CM | POA: Insufficient documentation

## 2014-03-07 NOTE — Assessment & Plan Note (Signed)
Tolerating statin, encouraged heart healthy diet, avoid trans fats, minimize simple carbs and saturated fats. Increase exercise as tolerated 

## 2014-03-07 NOTE — Assessment & Plan Note (Signed)
1x2 cm lesion at 10 oclock of right breast referred to diagnostic MGM

## 2014-03-07 NOTE — Assessment & Plan Note (Signed)
xrays obtained and PT ordered. Consider ortho referral

## 2014-03-07 NOTE — Assessment & Plan Note (Signed)
Patient encouraged to maintain heart healthy diet, regular exercise, adequate sleep. Consider daily probiotics. Take medications as prescribed. Declines flu shot. Pap due in 2016. MGM ordered today. Colonoscopy in 2013

## 2014-03-07 NOTE — Assessment & Plan Note (Signed)
Increase Vitamin D supplements

## 2014-03-16 ENCOUNTER — Ambulatory Visit
Admission: RE | Admit: 2014-03-16 | Discharge: 2014-03-16 | Disposition: A | Discharge status: Home / Self Care | Payer: BC Managed Care – PPO | Source: Ambulatory Visit | Attending: Family Medicine | Admitting: Family Medicine

## 2014-03-16 DIAGNOSIS — N649 Disorder of breast, unspecified: Secondary | ICD-10-CM

## 2014-09-28 ENCOUNTER — Other Ambulatory Visit: Payer: Self-pay

## 2014-09-28 DIAGNOSIS — Z1231 Encounter for screening mammogram for malignant neoplasm of breast: Secondary | ICD-10-CM

## 2014-10-27 ENCOUNTER — Ambulatory Visit
Admission: RE | Admit: 2014-10-27 | Discharge: 2014-10-27 | Disposition: A | Discharge status: Home / Self Care | Payer: BLUE CROSS/BLUE SHIELD | Source: Ambulatory Visit

## 2014-10-27 DIAGNOSIS — Z1231 Encounter for screening mammogram for malignant neoplasm of breast: Secondary | ICD-10-CM

## 2015-02-14 ENCOUNTER — Telehealth: Payer: Self-pay | Admitting: Family Medicine

## 2015-02-14 NOTE — Telephone Encounter (Signed)
Pre visit letter mailed 02/14/15  °

## 2015-03-04 ENCOUNTER — Encounter: Payer: BC Managed Care – PPO | Admitting: Family Medicine

## 2015-03-07 ENCOUNTER — Encounter: Payer: Self-pay | Admitting: Family Medicine

## 2015-08-03 ENCOUNTER — Encounter: Payer: Self-pay | Admitting: Family Medicine

## 2015-08-03 ENCOUNTER — Ambulatory Visit (INDEPENDENT_AMBULATORY_CARE_PROVIDER_SITE_OTHER): Payer: BLUE CROSS/BLUE SHIELD | Admitting: Family Medicine

## 2015-08-03 VITALS — BP 117/72 | HR 87 | Temp 98.0°F | Resp 20 | Ht 62.0 in | Wt 173.5 lb

## 2015-08-03 DIAGNOSIS — Z130 Encounter for screening for diseases of the blood and blood-forming organs and certain disorders involving the immune mechanism: Secondary | ICD-10-CM | POA: Diagnosis not present

## 2015-08-03 DIAGNOSIS — Z13220 Encounter for screening for lipoid disorders: Secondary | ICD-10-CM

## 2015-08-03 DIAGNOSIS — D473 Essential (hemorrhagic) thrombocythemia: Secondary | ICD-10-CM

## 2015-08-03 DIAGNOSIS — E559 Vitamin D deficiency, unspecified: Secondary | ICD-10-CM | POA: Diagnosis not present

## 2015-08-03 DIAGNOSIS — E785 Hyperlipidemia, unspecified: Secondary | ICD-10-CM | POA: Diagnosis not present

## 2015-08-03 DIAGNOSIS — Z1329 Encounter for screening for other suspected endocrine disorder: Secondary | ICD-10-CM

## 2015-08-03 DIAGNOSIS — D75839 Thrombocytosis, unspecified: Secondary | ICD-10-CM

## 2015-08-03 DIAGNOSIS — Z0000 Encounter for general adult medical examination without abnormal findings: Secondary | ICD-10-CM

## 2015-08-03 DIAGNOSIS — Z1321 Encounter for screening for nutritional disorder: Secondary | ICD-10-CM | POA: Diagnosis not present

## 2015-08-03 LAB — COMPREHENSIVE METABOLIC PANEL
ALK PHOS: 52 U/L (ref 39–117)
ALT: 30 U/L (ref 0–35)
AST: 22 U/L (ref 0–37)
Albumin: 4.6 g/dL (ref 3.5–5.2)
BUN: 14 mg/dL (ref 6–23)
CO2: 25 meq/L (ref 19–32)
Calcium: 9.8 mg/dL (ref 8.4–10.5)
Chloride: 106 mEq/L (ref 96–112)
Creatinine, Ser: 0.78 mg/dL (ref 0.40–1.20)
GFR: 81.6 mL/min (ref >60.00)
GLUCOSE: 97 mg/dL (ref 70–99)
Potassium: 4.4 mEq/L (ref 3.5–5.1)
SODIUM: 140 meq/L (ref 135–145)
TOTAL PROTEIN: 7.3 g/dL (ref 6.0–8.3)
Total Bilirubin: 0.5 mg/dL (ref 0.2–1.2)

## 2015-08-03 LAB — TSH: TSH: 1.18 u[IU]/mL (ref 0.35–4.50)

## 2015-08-03 LAB — LDL CHOLESTEROL, DIRECT: LDL DIRECT: 124 mg/dL

## 2015-08-03 LAB — LIPID PANEL
CHOL/HDL RATIO: 7
Cholesterol: 256 mg/dL — ABNORMAL HIGH (ref 0–200)
HDL: 36.4 mg/dL — ABNORMAL LOW (ref >39.00)
Triglycerides: 481 mg/dL — ABNORMAL HIGH (ref 0.0–149.0)

## 2015-08-03 LAB — VITAMIN D 25 HYDROXY (VIT D DEFICIENCY, FRACTURES): VITD: 31.54 ng/mL (ref 30.00–100.00)

## 2015-08-03 LAB — CBC WITH DIFFERENTIAL/PLATELET
BASOS ABS: 0.1 10*3/uL (ref 0.0–0.1)
Basophils Relative: 1 % (ref 0.0–3.0)
Eosinophils Absolute: 0.2 10*3/uL (ref 0.0–0.7)
Eosinophils Relative: 3.5 % (ref 0.0–5.0)
HCT: 40.2 % (ref 36.0–46.0)
Hemoglobin: 13.7 g/dL (ref 12.0–15.0)
LYMPHS ABS: 2.5 10*3/uL (ref 0.7–4.0)
Lymphocytes Relative: 37.3 % (ref 12.0–46.0)
MCHC: 34 g/dL (ref 30.0–36.0)
MCV: 85.6 fl (ref 78.0–100.0)
MONOS PCT: 7.6 % (ref 3.0–12.0)
Monocytes Absolute: 0.5 10*3/uL (ref 0.1–1.0)
NEUTROS PCT: 50.6 % (ref 43.0–77.0)
Neutro Abs: 3.3 10*3/uL (ref 1.4–7.7)
PLATELETS: 382 10*3/uL (ref 150.0–400.0)
RBC: 4.7 Mil/uL (ref 3.87–5.11)
RDW: 13.5 % (ref 11.5–15.5)
WBC: 6.6 10*3/uL (ref 4.0–10.5)

## 2015-08-03 NOTE — Patient Instructions (Signed)
It was a pleasure meeting you today. (Again) I will call you with results of labs if abnomral otherwise we will review on your PAP appt.  Make an appt for PAP after 30 days from today  Health Maintenance, Female Adopting a healthy lifestyle and getting preventive care can go a long way to promote health and wellness. Talk with your health care provider about what schedule of regular examinations is right for you. This is a good chance for you to check in with your provider about disease prevention and staying healthy. In between checkups, there are plenty of things you can do on your own. Experts have done a lot of research about which lifestyle changes and preventive measures are most likely to keep you healthy. Ask your health care provider for more information. WEIGHT AND DIET  Eat a healthy diet  Be sure to include plenty of vegetables, fruits, low-fat dairy products, and lean protein.  Do not eat a lot of foods high in solid fats, added sugars, or salt.  Get regular exercise. This is one of the most important things you can do for your health.  Most adults should exercise for at least 150 minutes each week. The exercise should increase your heart rate and make you sweat (moderate-intensity exercise).  Most adults should also do strengthening exercises at least twice a week. This is in addition to the moderate-intensity exercise.  Maintain a healthy weight  Body mass index (BMI) is a measurement that can be used to identify possible weight problems. It estimates body fat based on height and weight. Your health care provider can help determine your BMI and help you achieve or maintain a healthy weight.  For females 22 years of age and older:   A BMI below 18.5 is considered underweight.  A BMI of 18.5 to 24.9 is normal.  A BMI of 25 to 29.9 is considered overweight.  A BMI of 30 and above is considered obese.  Watch levels of cholesterol and blood lipids  You should start  having your blood tested for lipids and cholesterol at 55 years of age, then have this test every 5 years.  You may need to have your cholesterol levels checked more often if:  Your lipid or cholesterol levels are high.  You are older than 55 years of age.  You are at high risk for heart disease.  CANCER SCREENING   Lung Cancer  Lung cancer screening is recommended for adults 32-72 years old who are at high risk for lung cancer because of a history of smoking.  A yearly low-dose CT scan of the lungs is recommended for people who:  Currently smoke.  Have quit within the past 15 years.  Have at least a 30-pack-year history of smoking. A pack year is smoking an average of one pack of cigarettes a day for 1 year.  Yearly screening should continue until it has been 15 years since you quit.  Yearly screening should stop if you develop a health problem that would prevent you from having lung cancer treatment.  Breast Cancer  Practice breast self-awareness. This means understanding how your breasts normally appear and feel.  It also means doing regular breast self-exams. Let your health care provider know about any changes, no matter how small.  If you are in your 20s or 30s, you should have a clinical breast exam (CBE) by a health care provider every 1-3 years as part of a regular health exam.  If you are 40  or older, have a CBE every year. Also consider having a breast X-ray (mammogram) every year.  If you have a family history of breast cancer, talk to your health care provider about genetic screening.  If you are at high risk for breast cancer, talk to your health care provider about having an MRI and a mammogram every year.  Breast cancer gene (BRCA) assessment is recommended for women who have family members with BRCA-related cancers. BRCA-related cancers include:  Breast.  Ovarian.  Tubal.  Peritoneal cancers.  Results of the assessment will determine the need for  genetic counseling and BRCA1 and BRCA2 testing. Cervical Cancer Your health care provider may recommend that you be screened regularly for cancer of the pelvic organs (ovaries, uterus, and vagina). This screening involves a pelvic examination, including checking for microscopic changes to the surface of your cervix (Pap test). You may be encouraged to have this screening done every 3 years, beginning at age 74.  For women ages 8-65, health care providers may recommend pelvic exams and Pap testing every 3 years, or they may recommend the Pap and pelvic exam, combined with testing for human papilloma virus (HPV), every 5 years. Some types of HPV increase your risk of cervical cancer. Testing for HPV may also be done on women of any age with unclear Pap test results.  Other health care providers may not recommend any screening for nonpregnant women who are considered low risk for pelvic cancer and who do not have symptoms. Ask your health care provider if a screening pelvic exam is right for you.  If you have had past treatment for cervical cancer or a condition that could lead to cancer, you need Pap tests and screening for cancer for at least 20 years after your treatment. If Pap tests have been discontinued, your risk factors (such as having a new sexual partner) need to be reassessed to determine if screening should resume. Some women have medical problems that increase the chance of getting cervical cancer. In these cases, your health care provider may recommend more frequent screening and Pap tests. Colorectal Cancer  This type of cancer can be detected and often prevented.  Routine colorectal cancer screening usually begins at 55 years of age and continues through 55 years of age.  Your health care provider may recommend screening at an earlier age if you have risk factors for colon cancer.  Your health care provider may also recommend using home test kits to check for hidden blood in the  stool.  A small camera at the end of a tube can be used to examine your colon directly (sigmoidoscopy or colonoscopy). This is done to check for the earliest forms of colorectal cancer.  Routine screening usually begins at age 74.  Direct examination of the colon should be repeated every 5-10 years through 55 years of age. However, you may need to be screened more often if early forms of precancerous polyps or small growths are found. Skin Cancer  Check your skin from head to toe regularly.  Tell your health care provider about any new moles or changes in moles, especially if there is a change in a mole's shape or color.  Also tell your health care provider if you have a mole that is larger than the size of a pencil eraser.  Always use sunscreen. Apply sunscreen liberally and repeatedly throughout the day.  Protect yourself by wearing long sleeves, pants, a wide-brimmed hat, and sunglasses whenever you are outside. HEART DISEASE,  DIABETES, AND HIGH BLOOD PRESSURE   High blood pressure causes heart disease and increases the risk of stroke. High blood pressure is more likely to develop in:  People who have blood pressure in the high end of the normal range (130-139/85-89 mm Hg).  People who are overweight or obese.  People who are African American.  If you are 18-39 years of age, have your blood pressure checked every 3-5 years. If you are 40 years of age or older, have your blood pressure checked every year. You should have your blood pressure measured twice--once when you are at a hospital or clinic, and once when you are not at a hospital or clinic. Record the average of the two measurements. To check your blood pressure when you are not at a hospital or clinic, you can use:  An automated blood pressure machine at a pharmacy.  A home blood pressure monitor.  If you are between 55 years and 79 years old, ask your health care provider if you should take aspirin to prevent  strokes.  Have regular diabetes screenings. This involves taking a blood sample to check your fasting blood sugar level.  If you are at a normal weight and have a low risk for diabetes, have this test once every three years after 55 years of age.  If you are overweight and have a high risk for diabetes, consider being tested at a younger age or more often. PREVENTING INFECTION  Hepatitis B  If you have a higher risk for hepatitis B, you should be screened for this virus. You are considered at high risk for hepatitis B if:  You were born in a country where hepatitis B is common. Ask your health care provider which countries are considered high risk.  Your parents were born in a high-risk country, and you have not been immunized against hepatitis B (hepatitis B vaccine).  You have HIV or AIDS.  You use needles to inject street drugs.  You live with someone who has hepatitis B.  You have had sex with someone who has hepatitis B.  You get hemodialysis treatment.  You take certain medicines for conditions, including cancer, organ transplantation, and autoimmune conditions. Hepatitis C  Blood testing is recommended for:  Everyone born from 1945 through 1965.  Anyone with known risk factors for hepatitis C. Sexually transmitted infections (STIs)  You should be screened for sexually transmitted infections (STIs) including gonorrhea and chlamydia if:  You are sexually active and are younger than 55 years of age.  You are older than 55 years of age and your health care provider tells you that you are at risk for this type of infection.  Your sexual activity has changed since you were last screened and you are at an increased risk for chlamydia or gonorrhea. Ask your health care provider if you are at risk.  If you do not have HIV, but are at risk, it may be recommended that you take a prescription medicine daily to prevent HIV infection. This is called pre-exposure prophylaxis  (PrEP). You are considered at risk if:  You are sexually active and do not regularly use condoms or know the HIV status of your partner(s).  You take drugs by injection.  You are sexually active with a partner who has HIV. Talk with your health care provider about whether you are at high risk of being infected with HIV. If you choose to begin PrEP, you should first be tested for HIV. You should then   be tested every 3 months for as long as you are taking PrEP.  PREGNANCY   If you are premenopausal and you may become pregnant, ask your health care provider about preconception counseling.  If you may become pregnant, take 400 to 800 micrograms (mcg) of folic acid every day.  If you want to prevent pregnancy, talk to your health care provider about birth control (contraception). OSTEOPOROSIS AND MENOPAUSE   Osteoporosis is a disease in which the bones lose minerals and strength with aging. This can result in serious bone fractures. Your risk for osteoporosis can be identified using a bone density scan.  If you are 70 years of age or older, or if you are at risk for osteoporosis and fractures, ask your health care provider if you should be screened.  Ask your health care provider whether you should take a calcium or vitamin D supplement to lower your risk for osteoporosis.  Menopause may have certain physical symptoms and risks.  Hormone replacement therapy may reduce some of these symptoms and risks. Talk to your health care provider about whether hormone replacement therapy is right for you.  HOME CARE INSTRUCTIONS   Schedule regular health, dental, and eye exams.  Stay current with your immunizations.   Do not use any tobacco products including cigarettes, chewing tobacco, or electronic cigarettes.  If you are pregnant, do not drink alcohol.  If you are breastfeeding, limit how much and how often you drink alcohol.  Limit alcohol intake to no more than 1 drink per day for  nonpregnant women. One drink equals 12 ounces of beer, 5 ounces of wine, or 1 ounces of hard liquor.  Do not use street drugs.  Do not share needles.  Ask your health care provider for help if you need support or information about quitting drugs.  Tell your health care provider if you often feel depressed.  Tell your health care provider if you have ever been abused or do not feel safe at home.   This information is not intended to replace advice given to you by your health care provider. Make sure you discuss any questions you have with your health care provider.   Document Released: 11/13/2010 Document Revised: 05/21/2014 Document Reviewed: 04/01/2013 Elsevier Interactive Patient Education [employer].

## 2015-08-03 NOTE — Progress Notes (Signed)
Patient ID: Sarah Velazquez, female   DOB: Nov 14, 1960, 55 y.o.   MRN: [id number]      Patient ID: Sarah Velazquez, female  DOB: 10/14/1960, 55 y.o.   MRN: [id number]  Subjective:  Sarah Velazquez is a 56 y.o. female present for establish care from prior [facility] provider and annual exam.  All past medical history, surgical history, allergies, family history, immunizations, medications and social history were Updated in the electronic medical record today. All recent labs, ED visits and hospitalizations within the last year were reviewed.  Hyperlipidemia/ Hypertriglyceridemia: Pt does not desire to take statin. She was placed on a statin for 1 month after lipid pane results in 2015. She states her father experienced kidney failure after statin use and she fears the type of medication. She is taking fish oil and red yeast rice daily. She attempts to watch her diet and exercise, but admits not routinely on either. FH significant with MI in father in his 70's. She does not take a baby ASA daily.   Health maintenance:  Colonoscopy: hyperplastic polyp 2013, no FHX of colon cancer, mother with colon polyps. F/U 5-10 years unclear. Encouraged pt to call next year (5 year mark and discuss with GI).  Mammogram: Due 10/2015, no FHX. All normal mammogram per pt.  Cervical cancer screening: Last PAP 03/2012, normal, no co-testing completed.  Immunizations: Td UTD 2013, declined influenza Infectious disease screening: Indicated HIV and Hep B DEXA: 60-65 screen, estrogen deficient, vit d deficient.   Past Medical History  Diagnosis Date  . Chicken pox as a child  . Mumps as a child  . Allergy     seasonal, dogs, cats, horses, hay  . Hyperlipidemia   . Bell's palsy in high school    right  . Shingles     right cheek  . Impetigo as a kid    right face  . Menopause 2007  . Migraines   . Vitamin D deficiency   . Hyperglycemia   . Lipoma     growing slowly since 20s  . Preventative health care 09/13/2011  . Cervical cancer  screening 04/08/2012  . Thrombocytosis ([place]) 04/09/2012  . Hyperplastic colon polyp 04/09/2012  . Arthritis   . Asthma   . History of neutropenia     age 62-18, received gamma globulin monthly; with multiple hosp admisson for illness (per pt)   Allergies  Allergen Reactions  . Sulfa Antibiotics    Past Surgical History  Procedure Laterality Date  . Cyst removed      twice on back and 1 on foot  . Cesarean section  05-10-87  . Tonsillectomy  as a child  . Dilation and curettage of uterus  1989    incomplete miscarriage with second pregnancy and again post menopausal   Family History  Problem Relation Age of Onset  . Stroke Mother     X 3  . Heart attack Mother   . Diabetes Mother     type 2  . Anxiety disorder Mother   . Hypertension Mother   . Hyperlipidemia Mother   . Heart disease Mother   . Cancer Mother     breast, skin  . Colon polyps Mother   . Stroke Father     several  . Heart attack Father     several  . Kidney disease Father     on dialysis  . Hypertension Father   . Hyperlipidemia Father   . Cancer Father     mouth X  2/ chewed tobacco, and skin  . Anxiety disorder Father   . Colon polyps Father   . Hypertension Brother   . Hyperlipidemia Brother   . Depression Brother   . Cancer Maternal Grandmother     breast  . Diabetes Maternal Grandmother     type 2  . Obesity Maternal Grandmother   . Cancer Paternal Grandmother     lung/ didn't smoke  . Heart attack Paternal Grandfather   . Breast cancer Mother   . Breast cancer Maternal Grandmother   . Lung cancer Paternal Grandfather   . Squamous cell carcinoma Father    Social History   Social History  . Marital Status: Married    Spouse Name: N/A  . Number of Children: N/A  . Years of Education: N/A   Occupational History  . Not on file.   Social History Main Topics  . Smoking status: Never Smoker   . Smokeless tobacco: Never Used  . Alcohol Use: 1.8 oz/week    3 Glasses of wine per week      Comment: occasionally  . Drug Use: No  . Sexual Activity: Yes   Other Topics Concern  . Not on file   Social History Narrative   Married to [place]. Has 3 children Jaci Standard, [place], [place])   Bachelors of science, homemaker.   Drinks caffeinated beverages, uses herbal remedies, takes a daily vitamin.   Wears her seatbelt, bicycle helmet and exercise is greater than 3 times a week.   Patient eats vegetarian meals 5 days a week, minimal meat product.   Smoke detector in the home, firearms in the home.   Feels safe in her relationships.        ROS: Negative, with the exception of above mentioned in HPI  Objective: BP 117/72 mmHg  Pulse 87  Temp(Src) 98 F (36.7 C)  Resp 20  Ht '5\' 2"'  (1.575 m)  Wt 173 lb 8 oz (78.699 kg)  BMI 31.73 kg/m2  SpO2 96% Gen: Afebrile. No acute distress. Nontoxic in appearance, well-developed, well-nourished, female, Pleasant HENT: AT. [place]. Bilateral TM visualized and normal in appearance, normal external auditory canal. MMM, no oral lesions, good dentition. Bilateral nares without erythema or bogginess. Throat without erythema, ulcerations or exudates. No Cough on exam, no hoarseness on exam. Eyes:Pupils Equal Round Reactive to light, Extraocular movements intact,  Conjunctiva without redness, discharge or icterus. Neck/lymp/endocrine: Supple, no lymphadenopathy, no thyromegaly CV: RRR no murmur appreciated, no edema, +2/4 P posterior tibialis pulses.  Chest: CTAB, no wheeze, rhonchi or crackles. Normal Respiratory effort. Good Air movement. Abd: Soft. Obese. NTND. BS present. No Masses palpated. No hepatosplenomegaly. No rebound tenderness or guarding. Skin: No rashes, purpura or petechiae. Warm and well-perfused. Skin intact. Neuro/Msk:  Normal gait. PERLA. EOMi. Alert. Oriented x3.  Cranial nerves II through XII intact. Muscle strength 5/5 upper and lower extremity. DTRs equal bilaterally. Psych: Normal affect, dress and demeanor. Normal speech.  Normal thought content and judgment.  Assessment/plan: Sarah Velazquez is a 55 y.o. female present for establishment of care/preventive.  Screening for thyroid disorder - TSH  Vitamin D deficiency - continue current supplement dose, recheck Vit D. Pt has not had re-check since last low D identified. - Vitamin D (25 hydroxy)  Preventative health care - CMET - schedule PAP - Baby ASA daily if able to tolerate.  - Routine exercise and dietary modifications discussed. - AVS for gender specific health maintenance supplied.  Colonoscopy: hyperplastic polyp 2013, no FHX of colon cancer,  mother with colon polyps. F/U 5-10 years unclear. Encouraged pt to call next year (5 year mark and discuss with GI).  Mammogram: Due 10/2015, no FHX. All normal mammogram per pt.  Cervical cancer screening: Last PAP 03/2012, normal, no co-testing completed. Pt to schedule for PAP.  Immunizations: Td UTD 2013, declined influenza Infectious disease screening: Indicated HIV and Hep B--> will offer with PAP DEXA: 60-65 screen, estrogen deficient, vit d deficient.   Hyperlipidemia/hypertriglyceridemia - continue red yeast rice and fish oil. Dietary modifications and exercise discussed. Increase fiber recommended.  - will give more specific recommendations once results are obtained. Pt has not had recheck since 2015 when elevation was found.  - Lipid Profile - Comp Met (CMET)  Thrombocytosis (HCC)/ ? H/o of neutropenia - Pt states she use ot have low immunity and was told her white count was low, when she was a kid. She received gamma globin monthly form age 56-18, with multiple hospitalizations.  - CBC w/Diff   Return in about 4 weeks (around 08/31/2015), or PAP, for Office visit.  Electronically signed by: Howard Pouch, DO [facility]

## 2015-08-04 ENCOUNTER — Telehealth: Payer: Self-pay | Admitting: Family Medicine

## 2015-08-04 NOTE — Telephone Encounter (Signed)
Spoke with patient reviewed results and instructions. Patient will check her schedule and call back to set up appt for PAP. Patient verbalized understanding of all instructions.

## 2015-08-04 NOTE — Telephone Encounter (Signed)
Please call pt: - Encourage her to schedule her PAP as we discussed at her appt. I will go over her labs in detail at that visit.  - Briefly: CBC, CMP, thyroid are normal.  - Vit D is low normal, her med rec states she takes daily supplement, she needs to make sure she is taking D with a meal (needs fat to absorb). She could also add 1 extra dose a week to help elevate - Her cholesterol is high. Her triglycerides are extremely high. She needs to take 3000-4000 mg of fish oil daily, increase daily fiber and exercise. We will to monitor closely and will discuss in more detail at next appt.

## 2015-09-29 HISTORY — PX: INJECTION KNEE: SHX2446

## 2015-09-30 ENCOUNTER — Encounter: Payer: Self-pay | Admitting: Family Medicine

## 2015-09-30 ENCOUNTER — Ambulatory Visit (INDEPENDENT_AMBULATORY_CARE_PROVIDER_SITE_OTHER): Payer: BLUE CROSS/BLUE SHIELD | Admitting: Family Medicine

## 2015-09-30 ENCOUNTER — Other Ambulatory Visit (HOSPITAL_COMMUNITY)
Admission: RE | Admit: 2015-09-30 | Discharge: 2015-09-30 | Disposition: A | Discharge status: Home / Self Care | Payer: BLUE CROSS/BLUE SHIELD | Source: Ambulatory Visit | Attending: Family Medicine | Admitting: Family Medicine

## 2015-09-30 VITALS — BP 112/67 | HR 89 | Temp 98.4°F | Resp 20 | Ht 62.0 in | Wt 166.8 lb

## 2015-09-30 DIAGNOSIS — N952 Postmenopausal atrophic vaginitis: Secondary | ICD-10-CM | POA: Diagnosis not present

## 2015-09-30 DIAGNOSIS — Z124 Encounter for screening for malignant neoplasm of cervix: Secondary | ICD-10-CM | POA: Diagnosis not present

## 2015-09-30 DIAGNOSIS — Z01419 Encounter for gynecological examination (general) (routine) without abnormal findings: Secondary | ICD-10-CM | POA: Diagnosis present

## 2015-09-30 DIAGNOSIS — Z114 Encounter for screening for human immunodeficiency virus [HIV]: Secondary | ICD-10-CM | POA: Diagnosis not present

## 2015-09-30 DIAGNOSIS — Z1231 Encounter for screening mammogram for malignant neoplasm of breast: Secondary | ICD-10-CM

## 2015-09-30 DIAGNOSIS — Z1239 Encounter for other screening for malignant neoplasm of breast: Secondary | ICD-10-CM | POA: Insufficient documentation

## 2015-09-30 DIAGNOSIS — E785 Hyperlipidemia, unspecified: Secondary | ICD-10-CM

## 2015-09-30 DIAGNOSIS — Z1151 Encounter for screening for human papillomavirus (HPV): Secondary | ICD-10-CM | POA: Diagnosis not present

## 2015-09-30 DIAGNOSIS — Z1159 Encounter for screening for other viral diseases: Secondary | ICD-10-CM | POA: Insufficient documentation

## 2015-09-30 LAB — HIV ANTIBODY (ROUTINE TESTING W REFLEX): HIV: NONREACTIVE

## 2015-09-30 MED ORDER — ESTROGENS, CONJUGATED 0.625 MG/GM VA CREA: 1.0000 | TOPICAL_CREAM | Freq: Every day | VAGINAL | Status: DC

## 2015-09-30 NOTE — Progress Notes (Signed)
Patient ID: Sarah Velazquez, female   DOB: 17-Sep-1960, 55 y.o.   MRN: [id number]    Sarah, Velazquez 28-Jun-1960, 55 y.o., female MRN: [id number]  CC: well women/gynecological exam  Routine gynecological exam: Pt presents for routine cervical cancer screening. She denies vaginal bleeding for over 10 years. She states she still has some hot flashes, but not like they use to be. She denies vaginal irritation or discharge. She admits to discomfort with sexual intercourse. She is needing to use [facility], but it is still uncomfortable. She has lack of desire for sexual intercourse, she states she is just not interested in it anymore. She is UTD with mammogram, due next month ([facility]). Colonoscopy also due next year (5 year). Last PAP 2013, normal without co-test.     Allergies  Allergen Reactions  . Sulfa Antibiotics    Social History  Substance Use Topics  . Smoking status: Never Smoker   . Smokeless tobacco: Never Used  . Alcohol Use: 1.8 oz/week    3 Glasses of wine per week     Comment: occasionally   Past Medical History  Diagnosis Date  . Chicken pox as a child  . Mumps as a child  . Allergy     seasonal, dogs, cats, horses, hay  . Hyperlipidemia   . Bell's palsy in high school    right  . Shingles     right cheek  . Impetigo as a kid    right face  . Menopause 2007  . Migraines   . Vitamin D deficiency   . Hyperglycemia   . Lipoma     growing slowly since 20s  . Preventative health care 09/13/2011  . Cervical cancer screening 04/08/2012  . Thrombocytosis ([place]) 04/09/2012  . Hyperplastic colon polyp 04/09/2012  . Arthritis   . Asthma   . History of neutropenia     age [age], received gamma globulin monthly; with multiple hosp admisson for illness (per pt)   Past Surgical History  Procedure Laterality Date  . Cyst removed      twice on back and 1 on foot  . Cesarean section  05-10-87  . Tonsillectomy  as a child  . Dilation and curettage of uterus  1989    incomplete  miscarriage with second pregnancy and again post menopausal  . Injection knee Bilateral 09/29/15    Gel   Family History  Problem Relation Age of Onset  . Stroke Mother     X 3  . Heart attack Mother   . Diabetes Mother     type 2  . Anxiety disorder Mother   . Hypertension Mother   . Hyperlipidemia Mother   . Heart disease Mother   . Cancer Mother     breast, skin  . Colon polyps Mother   . Stroke Father     several  . Heart attack Father     several  . Kidney disease Father     on dialysis  . Hypertension Father   . Hyperlipidemia Father   . Cancer Father     mouth X 2/ chewed tobacco, and skin  . Anxiety disorder Father   . Colon polyps Father   . Hypertension Brother   . Hyperlipidemia Brother   . Depression Brother   . Cancer Maternal Grandmother     breast  . Diabetes Maternal Grandmother     type 2  . Obesity Maternal Grandmother   . Cancer Paternal Grandmother  lung/ didn't smoke  . Heart attack Paternal Grandfather   . Breast cancer Mother   . Breast cancer Maternal Grandmother   . Lung cancer Paternal Grandfather   . Squamous cell carcinoma Father      Medication List       This list is accurate as of: 09/30/15  3:13 PM.  Always use your most recent med list.               co-enzyme Q-10 30 MG capsule  Take 30 mg by mouth 3 (three) times daily.     glucosamine-chondroitin 500-400 MG tablet  Take 1 tablet by mouth 3 (three) times daily.     Omega 3 1200 MG Caps  Take by mouth.     PROBIOTIC ADVANCED PO  Take by mouth.     RED YEAST RICE PO  Take by mouth.     Vitamin D 2000 units Caps  Take by mouth daily.     vitamin E 200 UNIT capsule  Take 200 Units by mouth daily.         ROS: Negative, with the exception of above mentioned in HPI   Objective:  BP 112/67 mmHg  Pulse 89  Temp(Src) 98.4 F (36.9 C) (Oral)  Resp 20  Ht 5\' 2"  (1.575 m)  Wt 166 lb 12 oz (75.637 kg)  BMI 30.49 kg/m2  SpO2 96% Body mass index is  30.49 kg/(m^2). Gen: Afebrile. No acute distress.  HENT: AT. [place] MMM, no oral lesions. Abd: Soft. NTND. BS present. No Masses palpated. No rebound or guarding.  Breasts: breasts appear normal, symmetrical, no tenderness on exam, no suspicious masses, no skin or nipple changes or axillary nodes. GYN:  External genitalia within normal limits, normal hair distribution, no lesions. Urethral meatus normal, no lesions. Vaginal mucosa pink,  no lesions, postmenopausal changes noted. No cystocele or rectocele. cervix without lesions, no discharge Bimanual exam revealed normal uterus.  No bladder/suprapubic fullness, masses or tenderness. No cervical motion tenderness. No adnexal fullness. Anus and perineum within normal limits, no lesions.  Assessment/Plan: Derian Urdahl is a 55 y.o. female present for acute OV for Pap smear for cervical cancer screening/well women/infectious disease screen - Cytology - PAP with HPV - HIV antibody (with reflex)/ Hepatitis C Antibody testing today, pt amendable to screening.  - Premarin cream start daily for 2 weeks, then twice a week dosing to follow.   Perimenopausal atrophic vaginitis Hyperlipidemia:  - Discussed in length diet, exercise modifications to decrease her cholesterol.  - Increase fish oil to 3g daily, continue red yeast rice. - pt does not want to use a statin, discussed fenofibrate for triglycerides. She may be ok with that if still elevated on recheck.  - F/U 3 months for repeat labs. - education provided to patient.    > 25 minutes spent with patient, >50% of time spent face to face counseling patient and coordinating care.  electronically signed by:  Howard Pouch, DO  [place]

## 2015-09-30 NOTE — Patient Instructions (Signed)
Atrophic Vaginitis Atrophic vaginitis is a condition in which the tissues that line the vagina become dry and thin. This condition is most common in women who have stopped having regular menstrual periods (menopause). This usually starts when a woman is 45-55 years old. Estrogen helps to keep the vagina moist. It stimulates the vagina to produce a clear fluid that lubricates the vagina for sexual intercourse. This fluid also protects the vagina from infection. Lack of estrogen can cause the lining of the vagina to get thinner and dryer. The vagina may also shrink in size. It may become less elastic. Atrophic vaginitis tends to get worse over time as a woman's estrogen level drops. CAUSES This condition is caused by the normal drop in estrogen that happens around the time of menopause. RISK FACTORS Certain conditions or situations may lower a woman's estrogen level, which increases her risk of atrophic vaginitis. These include:  Taking medicine that blocks estrogen.  Having ovaries removed surgically.  Being treated for cancer with X-ray treatment (radiation) or medicines (chemotherapy).  Exercising very hard and often.  Having an eating disorder (anorexia).  Giving birth or breastfeeding.  Being over the age of 50.  Smoking. SYMPTOMS Symptoms of this condition include:  Pain, soreness, or bleeding during sexual intercourse (dyspareunia).  Vaginal burning, irritation, or itching.  Pain or bleeding during a vaginal examination using a speculum (pelvic exam).  Loss of interest in sexual activity.  Having burning pain when passing urine.  Vaginal discharge that is brown or yellow. In some cases, there are no symptoms. DIAGNOSIS This condition is diagnosed with a medical history and physical exam. This will include a pelvic exam that checks whether the inside of your vagina appears pale, thin, or dry. Rarely, you may also have other tests, including:  A urine test.  A test that  checks the acid balance in your vaginal fluid (acid balance test). TREATMENT Treatment for this condition may depend on the severity of your symptoms. Treatment may include:  Using an over-the-counter vaginal lubricant before you have sexual intercourse.  Using a long-acting vaginal moisturizer.  Using low-dose vaginal estrogen for moderate to severe symptoms that do not respond to other treatments. Options include creams, tablets, and inserts (vaginal rings). Before using vaginal estrogen, tell your health care provider if you have a history of:  Breast cancer.  Endometrial cancer.  Blood clots.  Taking medicines. You may be able to take a daily pill for dyspareunia. Discuss all of the risks of this medicine with your health care provider. It is usually not recommended for women who have a family history or personal history of breast cancer. If your symptoms are very mild and you are not sexually active, you may not need treatment. HOME CARE INSTRUCTIONS  Take medicines only as directed by your health care provider. Do not use herbal or alternative medicines unless your health care provider says that you can.  Use over-the-counter creams, lubricants, or moisturizers for dryness only as directed by your health care provider.  If your atrophic vaginitis is caused by menopause, discuss all of your menopausal symptoms and treatment options with your health care provider.  Do not douche.  Do not use products that can make your vagina dry. These include:  Scented feminine sprays.  Scented tampons.  Scented soaps.  If it hurts to have sex, talk with your sexual partner. SEEK MEDICAL CARE IF:  Your discharge looks different than normal.  Your vagina has an unusual smell.  You have   new symptoms.  Your symptoms do not improve with treatment.  Your symptoms get worse.   This information is not intended to replace advice given to you by your health care provider. Make sure you  discuss any questions you have with your health care provider.   Document Released: 09/14/2014 Document Reviewed: 09/14/2014 Elsevier Interactive Patient Education 2016 Elsevier Inc.  HDL: this is the "good cholesterol". Your level was  low, we like this part of the cholesterol to be higher. You could bring this up with eating foods such as olive oil, salmon, whole grains, beans/legumes and exercise. lower triglycerideswith exercise, low saturated fat, higher fiber diet. Increase fish oil to 3 G (3000 mg daily)    Food Choices to Lower Your Triglycerides Triglycerides are a type of fat in your blood. High levels of triglycerides can increase the risk of heart disease and stroke. If your triglyceride levels are high, the foods you eat and your eating habits are very important. Choosing the right foods can help lower your triglycerides.  WHAT GENERAL GUIDELINES DO I NEED TO FOLLOW?  Lose weight if you are overweight.   Limit or avoid alcohol.   Fill one half of your plate with vegetables and green salads.   Limit fruit to two servings a day. Choose fruit instead of juice.   Make one fourth of your plate whole grains. Look for the word "whole" as the first word in the ingredient list.  Fill one fourth of your plate with lean protein foods.  Enjoy fatty fish (such as salmon, mackerel, sardines, and tuna) three times a week.   Choose healthy fats.   Limit foods high in starch and sugar.  Eat more home-cooked food and less restaurant, buffet, and fast food.  Limit fried foods.  Cook foods using methods other than frying.  Limit saturated fats.  Check ingredient lists to avoid foods with partially hydrogenated oils (trans fats) in them. WHAT FOODS CAN I EAT?  Grains Whole grains, such as whole wheat or whole grain breads, crackers, cereals, and pasta. Unsweetened oatmeal, bulgur, barley, quinoa, or brown rice. Corn or whole wheat flour tortillas.  Vegetables Fresh or  frozen vegetables (raw, steamed, roasted, or grilled). Green salads. Fruits All fresh, canned (in natural juice), or frozen fruits. Meat and Other Protein Products Ground beef (85% or leaner), grass-fed beef, or beef trimmed of fat. Skinless chicken or [place]. Ground chicken or [place]. Pork trimmed of fat. All fish and seafood. Eggs. Dried beans, peas, or lentils. Unsalted nuts or seeds. Unsalted canned or dry beans. Dairy Low-fat dairy products, such as skim or 1% milk, 2% or reduced-fat cheeses, low-fat ricotta or cottage cheese, or plain low-fat yogurt. Fats and Oils Tub margarines without trans fats. Light or reduced-fat mayonnaise and salad dressings. Avocado. Safflower, olive, or canola oils. Natural peanut or almond butter. The items listed above may not be a complete list of recommended foods or beverages. Contact your dietitian for more options. WHAT FOODS ARE NOT RECOMMENDED?  Grains White bread. White pasta. White rice. Cornbread. Bagels, pastries, and croissants. Crackers that contain trans fat. Vegetables White potatoes. Corn. Creamed or fried vegetables. Vegetables in a cheese sauce. Fruits Dried fruits. Canned fruit in light or heavy syrup. Fruit juice. Meat and Other Protein Products Fatty cuts of meat. Ribs, chicken wings, bacon, sausage, bologna, salami, chitterlings, fatback, hot dogs, bratwurst, and packaged luncheon meats. Dairy Whole or 2% milk, cream, half-and-half, and cream cheese. Whole-fat or sweetened yogurt. Full-fat cheeses. Nondairy creamers  and whipped toppings. Processed cheese, cheese spreads, or cheese curds. Sweets and Desserts Corn syrup, sugars, honey, and molasses. Candy. Jam and jelly. Syrup. Sweetened cereals. Cookies, pies, cakes, donuts, muffins, and ice cream. Fats and Oils Butter, stick margarine, lard, shortening, ghee, or bacon fat. Coconut, palm kernel, or palm oils. Beverages Alcohol. Sweetened drinks (such as sodas, lemonade, and fruit  drinks or punches). The items listed above may not be a complete list of foods and beverages to avoid. Contact your dietitian for more information.   This information is not intended to replace advice given to you by your health care provider. Make sure you discuss any questions you have with your health care provider.   Document Released: 02/16/2004 Document Revised: 05/21/2014 Document Reviewed: 03/04/2013 Elsevier Interactive Patient Education [employer].

## 2015-10-01 LAB — HEPATITIS C ANTIBODY: HCV Ab: NEGATIVE

## 2015-10-03 ENCOUNTER — Other Ambulatory Visit: Payer: Self-pay | Admitting: Family Medicine

## 2015-10-03 DIAGNOSIS — Z1239 Encounter for other screening for malignant neoplasm of breast: Secondary | ICD-10-CM

## 2015-10-04 ENCOUNTER — Telehealth: Payer: Self-pay | Admitting: Family Medicine

## 2015-10-04 LAB — CYTOLOGY - PAP

## 2015-10-04 NOTE — Telephone Encounter (Signed)
Please call pt: - HIV/Hep C negative - PAP normal, with negative HPV. Next screen 3-5 years

## 2015-10-05 NOTE — Telephone Encounter (Signed)
Spoke with patient reviewed lab results and information. 

## 2015-11-01 ENCOUNTER — Ambulatory Visit
Admission: RE | Admit: 2015-11-01 | Discharge: 2015-11-01 | Disposition: A | Discharge status: Home / Self Care | Payer: BLUE CROSS/BLUE SHIELD | Source: Ambulatory Visit | Attending: Family Medicine | Admitting: Family Medicine

## 2015-11-01 DIAGNOSIS — Z1239 Encounter for other screening for malignant neoplasm of breast: Secondary | ICD-10-CM

## 2015-11-04 ENCOUNTER — Other Ambulatory Visit: Payer: Self-pay | Admitting: Family Medicine

## 2015-11-04 DIAGNOSIS — R928 Other abnormal and inconclusive findings on diagnostic imaging of breast: Secondary | ICD-10-CM

## 2015-11-07 ENCOUNTER — Ambulatory Visit
Admission: RE | Admit: 2015-11-07 | Discharge: 2015-11-07 | Disposition: A | Discharge status: Home / Self Care | Payer: BLUE CROSS/BLUE SHIELD | Source: Ambulatory Visit | Attending: Family Medicine | Admitting: Family Medicine

## 2015-11-07 DIAGNOSIS — R928 Other abnormal and inconclusive findings on diagnostic imaging of breast: Secondary | ICD-10-CM | POA: Insufficient documentation

## 2015-12-01 DIAGNOSIS — M170 Bilateral primary osteoarthritis of knee: Secondary | ICD-10-CM | POA: Diagnosis not present

## 2016-04-26 ENCOUNTER — Other Ambulatory Visit: Payer: Self-pay | Admitting: Family Medicine

## 2016-04-26 DIAGNOSIS — N6002 Solitary cyst of left breast: Secondary | ICD-10-CM

## 2016-05-08 ENCOUNTER — Ambulatory Visit
Admission: RE | Admit: 2016-05-08 | Discharge: 2016-05-08 | Disposition: A | Discharge status: Home / Self Care | Payer: BLUE CROSS/BLUE SHIELD | Source: Ambulatory Visit | Attending: Family Medicine | Admitting: Family Medicine

## 2016-05-08 DIAGNOSIS — R928 Other abnormal and inconclusive findings on diagnostic imaging of breast: Secondary | ICD-10-CM | POA: Diagnosis not present

## 2016-05-08 DIAGNOSIS — N6001 Solitary cyst of right breast: Secondary | ICD-10-CM | POA: Diagnosis not present

## 2016-05-08 DIAGNOSIS — N6002 Solitary cyst of left breast: Secondary | ICD-10-CM

## 2016-05-21 ENCOUNTER — Ambulatory Visit (INDEPENDENT_AMBULATORY_CARE_PROVIDER_SITE_OTHER): Payer: BLUE CROSS/BLUE SHIELD | Admitting: Family Medicine

## 2016-05-21 ENCOUNTER — Encounter: Payer: Self-pay | Admitting: Family Medicine

## 2016-05-21 VITALS — BP 135/82 | HR 77 | Temp 98.0°F | Resp 20 | Ht 62.0 in | Wt 162.0 lb

## 2016-05-21 DIAGNOSIS — E785 Hyperlipidemia, unspecified: Secondary | ICD-10-CM | POA: Diagnosis not present

## 2016-05-21 DIAGNOSIS — E559 Vitamin D deficiency, unspecified: Secondary | ICD-10-CM

## 2016-05-21 DIAGNOSIS — R928 Other abnormal and inconclusive findings on diagnostic imaging of breast: Secondary | ICD-10-CM

## 2016-05-21 LAB — VITAMIN D 25 HYDROXY (VIT D DEFICIENCY, FRACTURES): VITD: 85.52 ng/mL (ref 30.00–100.00)

## 2016-05-21 LAB — LIPID PANEL
Cholesterol: 273 mg/dL — ABNORMAL HIGH (ref 0–200)
HDL: 41.7 mg/dL (ref >39.00)
NonHDL: 231.09
Total CHOL/HDL Ratio: 7
Triglycerides: 368 mg/dL — ABNORMAL HIGH (ref 0.0–149.0)
VLDL: 73.6 mg/dL — ABNORMAL HIGH (ref 0.0–40.0)

## 2016-05-21 LAB — LDL CHOLESTEROL, DIRECT: Direct LDL: 161 mg/dL

## 2016-05-21 NOTE — Progress Notes (Signed)
Sarah Velazquez, [name] 09/11/1960, 56 y.o., female MRN: [id number] Patient Care Team    Relationship Specialty Notifications Start End  Ma Hillock, DO PCP - General Family Medicine  08/03/15   Sarah Feil, MD Consulting Physician Gastroenterology  08/03/15     CC: Cardiac concerns Subjective:   Patient presents today asking for "blood work "to be completed. She is about to start a new diet that is Vegan, and an exercise regimen. She states that she's had a few of her in-laws have heart attacks over the last few weeks. She has a strong family of heart disease. She has had hyperlipidemia in the past, and has declined statin use. She reported her father had "kidney failure "secondary to statin use. She denies chest pain, shortness of breath, palpitations. She also reports muscle skeletal pain over her breast. She had concerns this was from the lump/abnormal mammogram of the left breast. She had her repeat mammogram on December 26 as scheduled, which showed benign cyst. She states she is uncertain if the discomfort in her chest is secondary to her heart, her breast or in her "brain". She reports the discomfort is muscle skeletal, seems to be worse when laying in bed and she is thinking about it, and she rubs the area and it seems to improve the discomfort. Patient also has a history of low vitamin D, she states she has not been taking her vitamin D. She wants to start back on the 2000 units daily, but wants to make sure her vitamin D is inadequate and she needs the higher dosage.   Allergies  Allergen Reactions  . Sulfa Antibiotics    Social History  Substance Use Topics  . Smoking status: Never Smoker  . Smokeless tobacco: Never Used  . Alcohol use 1.8 oz/week    3 Glasses of wine per week     Comment: occasionally   Past Medical History:  Diagnosis Date  . Allergy    seasonal, dogs, cats, horses, hay  . Arthritis   . Asthma   . Bell's palsy in high school   right  . Cervical cancer  screening 04/08/2012  . Chicken pox as a child  . History of neutropenia    age [age], received gamma globulin monthly; with multiple hosp admisson for illness (per pt)  . Hyperglycemia   . Hyperlipidemia   . Hyperplastic colon polyp 04/09/2012  . Impetigo as a kid   right face  . Lipoma    growing slowly since 20s  . Menopause 2007  . Migraines   . Mumps as a child  . Preventative health care 09/13/2011  . Shingles    right cheek  . Thrombocytosis ([place]) 04/09/2012  . Vitamin D deficiency    Past Surgical History:  Procedure Laterality Date  . CESAREAN SECTION  05-10-87  . cyst removed     twice on back and 1 on foot  . DILATION AND CURETTAGE OF UTERUS  1989   incomplete miscarriage with second pregnancy and again post menopausal  . INJECTION KNEE Bilateral 09/29/15   Gel  . TONSILLECTOMY  as a child   Family History  Problem Relation Age of Onset  . Stroke Mother     X 3  . Heart attack Mother   . Diabetes Mother     type 2  . Anxiety disorder Mother   . Hypertension Mother   . Hyperlipidemia Mother   . Heart disease Mother   . Cancer Mother  breast, skin  . Colon polyps Mother   . Stroke Father     several  . Heart attack Father     several  . Kidney disease Father     on dialysis  . Hypertension Father   . Hyperlipidemia Father   . Cancer Father     mouth X 2/ chewed tobacco, and skin  . Anxiety disorder Father   . Colon polyps Father   . Hypertension Brother   . Hyperlipidemia Brother   . Depression Brother   . Cancer Maternal Grandmother     breast  . Diabetes Maternal Grandmother     type 2  . Obesity Maternal Grandmother   . Cancer Paternal Grandmother     lung/ didn't smoke  . Heart attack Paternal Grandfather   . Breast cancer Mother   . Breast cancer Maternal Grandmother   . Lung cancer Paternal Grandfather   . Squamous cell carcinoma Father    Allergies as of 05/21/2016      Reactions   Sulfa Antibiotics       Medication List        Accurate as of 05/21/16  8:35 AM. Always use your most recent med list.          co-enzyme Q-10 30 MG capsule Take 30 mg by mouth 3 (three) times daily.   conjugated estrogens vaginal cream Commonly known as:  PREMARIN Place 1 Applicatorful vaginally daily. 1 applicator full intravaginally  daily for 2 weeks, then 1 applicator full two times a week.   glucosamine-chondroitin 500-400 MG tablet Take 1 tablet by mouth 3 (three) times daily.   Omega 3 1200 MG Caps Take by mouth.   PROBIOTIC ADVANCED PO Take by mouth.   RED YEAST RICE PO Take by mouth.   Vitamin D 2000 units Caps Take by mouth daily.   vitamin E 200 UNIT capsule Take 200 Units by mouth daily.       No results found for this or any previous visit (from the past 24 hour(s)). No results found.   ROS: Negative, with the exception of above mentioned in HPI   Objective:  BP 135/82 (BP Location: Left Arm, Patient Position: Sitting, Cuff Size: Normal)   Pulse 77   Temp 98 F (36.7 C)   Resp 20   Ht 5\' 2"  (1.575 m)   Wt 162 lb (73.5 kg)   SpO2 99%   BMI 29.63 kg/m  Body mass index is 29.63 kg/m. Gen: Afebrile. No acute distress. Nontoxic in appearance, well developed, well nourished.  HENT: AT. [place].  MMM, Eyes:Pupils Equal Round Reactive to light, Extraocular movements intact,  Conjunctiva without redness, discharge or icterus. CV: RRR no murmur appreciated, no edema. No carotid bruit appreciated Chest: CTAB, no wheeze or crackles. Good air movement, normal resp effort.  Abd: Soft. NTND. BS present. No Masses palpated. Neuro: Normal gait. PERLA. EOMi. Alert. Oriented x3  Psych: Nervous. Otherwise Normal affect, dress and demeanor. Normal speech. Normal thought content and judgment.  Assessment/Plan: Sarah Velazquez is a 56 y.o. female present for OV for  Hyperlipidemia, unspecified hyperlipidemia type - Discussed with patient she has had a rather high lipids and triglycerides in the past. She is taking red  yeast rice and fish oil. She has refused statins. She is asking for a calcium score to be performed today. Discussed with patient the important factor is to reduce risk factors for herself including diet, exercise and by prior lab statin would be beneficial for  her. Given her family history, will repeat lipids today. If she desires once results are obtained we can refer her to cardiology for further screening, although she is currently asymptomatic. - Lipid panel - Advised her to make her physical in 3 months, and if any changes are made her cholesterol after these a result, that'll be a good time to retest. Vitamin D deficiency Repeat level today, patient strongly encouraged to maintain regular supplementation. - Vitamin D (25 hydroxy) Abnormal mammogram Discussed her findings from her last mammogram on December 26. Although her findings are benign there are cysts present which can cause achiness of the breast. Reassured patient to maintain follow-ups as recommended, repeat diagnostic mammogram with ultrasound and may beginning of June.  Follow-up 3 months for CPE   electronically signed by:  Howard Pouch, DO  [place]

## 2016-05-21 NOTE — Patient Instructions (Signed)
I will call you with labs once results available.  We will follow in 3 months after today to repeat cholesterol if needed, and perform your  physical.   Increase exercise > 150 minutes a week, and restart vitamins discussed.    Please help [place] help you:  It is a privilege to be able to take care of great patients such as yourself. We are honored you have chosen [facility] for your Primary Care home. Below you will find basic instructions that you may need to access in the future. Please help [place] help you by reading the instructions, which cover many of the frequent questions we experience.   Prescription refills and request:  -In order to allow more efficient response time, please call your pharmacy for all refills. They will forward the request electronically to [place]. This allows for the quickest possible response. Request left on a nurse line can take longer to refill, since these are checked as time allows between office patients and other phone calls.  - refill request can take up to 3-5 working days to complete.  - If request is sent electronically and request is appropiate, it is usually completed in 1-2 business days.  - all patients will need to be seen routinely for all chronic medical conditions requiring prescription medications (see follow-up below). If you are overdue for follow up on your condition, you will be asked to make an appointment and we will call in enough medication to cover you until your appointment (up to 30 days).  - all controlled substances will require a face to face visit to request/refill.  - if you desire your prescriptions to go through a new pharmacy, and have an active script at original pharmacy, you will need to call your pharmacy and have scripts transferred to new pharmacy. This is completed between the pharmacy locations and not by your provider.    Results: If any images or labs were ordered, it can take up to 1 week to get results depending on the  test ordered and the lab/facility running and resulting the test. - Normal or stable results, which do not need further discussion, will be released to your mychart immediately with attached note to you. A call will not be generated for normal results. Please make certain to sign up for mychart. If you have questions on how to activate your mychart you can call the front office.  - If your results need further discussion, our office will attempt to contact you via phone, and if unable to reach you after 2 attempts, we will release your abnormal result to your mychart with instructions.  - All results will be automatically released in mychart after 1 week.  - Your provider will provide you with explanation and instruction on all relevant material in your results. Please keep in mind, results and labs may appear confusing or abnormal to the untrained eye, but it does not mean they are actually abnormal for you personally. If you have any questions about your results that are not covered, or you desire more detailed explanation than what was provided, you should make an appointment with your provider to do so.   Our office handles many outgoing and incoming calls daily. If we have not contacted you within 1 week about your results, please check your mychart to see if there is a message first and if not, then contact our office.  In helping with this matter, you help decrease call volume, and therefore allow  [place] to be able to respond to patients needs more efficiently.   Acute office visits (sick visit):  An acute visit is intended for a new problem and are scheduled in shorter time slots to allow schedule openings for patients with new problems. This is the appropriate visit to discuss a new problem. In order to provide you with excellent quality medical care with proper time for you to explain your problem, have an exam and receive treatment with instructions, these appointments should be limited to one new  problem per visit. If you experience a new problem, in which you desire to be addressed, please make an acute office visit, we save openings on the schedule to accommodate you. Please do not save your new problem for any other type of visit, let us take care of it properly and quickly for you.   Follow up visits:  Depending on your condition(s) your provider will need to see you routinely in order to provide you with quality care and prescribe medication(s). Most chronic conditions (Example: hypertension, Diabetes, depression/anxiety... etc), require visits a couple times a year. Your provider will instruct you on proper follow up for your personal medical conditions and history. Please make certain to make follow up appointments for your condition as instructed. Failing to do so could result in lapse in your medication treatment/refills. If you request a refill, and are overdue to be seen on a condition, we will always provide you with a 30 day script (once) to allow you time to schedule.    Medicare wellness (well visit): - we have a wonderful Nurse Maudie Mercury), that will meet with you and provide you will yearly medicare wellness visits. These visits should occur yearly (can not be scheduled less than 1 calendar year apart) and cover preventive health, immunizations, advance directives and screenings you are entitled to yearly through your medicare benefits. Do not miss out on your entitled benefits, this is when medicare will pay for these benefits to be ordered for you.  These are strongly encouraged by your provider and is the appropriate type of visit to make certain you are up to date with all preventive health benefits. If you have not had your medicare wellness exam in the last 12 months, please make certain to schedule one by calling the office and schedule your medicare wellness with Maudie Mercury as soon as possible.   Yearly physical (well visit):  - Adults are recommended to be seen yearly for physicals.  Check with your insurance and date of your last physical, most insurances require one calendar year between physicals. Physicals include all preventive health topics, screenings, medical exam and labs that are appropriate for gender/age and history. You may have fasting labs needed at this visit. This is a well visit (not a sick visit), acute topics should not be covered during this visit.  - Pediatric patients are seen more frequently when they are younger. Your provider will advise you on well child visit timing that is appropriate for your their age. - This is not a medicare wellness visit. Medicare wellness exams do not have an exam portion to the visit. Some medicare companies allow for a physical, some do not allow a yearly physical. If your medicare allows a yearly physical you can schedule the medicare wellness with our nurse Maudie Mercury and have your physical with your provider after, on the same day. Please check with insurance for your full benefits.   Late Policy/No Shows:  - all new patients should arrive 15-30  minutes earlier than appointment to allow [place] time  to  obtain all personal demographics,  insurance information and for you to complete office paperwork. - All established patients should arrive 10-15 minutes earlier than appointment time to update all information and be checked in .  - In our best efforts to run on time, if you are late for your appointment you will be asked to either reschedule or if able, we will work you back into the schedule. There will be a wait time to work you back in the schedule,  depending on availability.  - If you are unable to make it to your appointment as scheduled, please call 24 hours ahead of time to allow [place] to fill the time slot with someone else who needs to be seen. If you do not cancel your appointment ahead of time, you may be charged a no show fee.

## 2016-05-22 ENCOUNTER — Telehealth: Payer: Self-pay | Admitting: Family Medicine

## 2016-05-22 NOTE — Telephone Encounter (Signed)
Spoke with patient reviewed lab results . Patient prefers to try diet and exercise prior to starting a statin she will follow up in 3 months to recheck and see if an improvement. Patient declines cardiology referral at this time.

## 2016-05-22 NOTE — Telephone Encounter (Signed)
Please call pt: - her vit d is 12. I would suggest she take just 1000 u daily. This  level is best around 40-50.  - Her cholesterol levels are elevated and concerning given her fhx. We have discussed statin use, and she has declined in the past. She voiced concern last appt of her risk factors and desired a cardiac calcium score completed. -  I do recommend a statin to be used for lowering cholesterol and cardiovascular protection. Increase fiber, exercise > 150 minutes a week. Low saturated fat diet.  - if she desires a second opinion and/or to have cardiac calcium score completed, I would be happy to refer her to a cardiologist that would evaluate, review fhx and complete that for her.  - please advise on her decision and will call in medication and/or referral if she desires.  Lipid Panel     Component Value Date/Time   CHOL 273 (H) 05/21/2016 0906   TRIG 368.0 (H) 05/21/2016 0906   HDL 41.70 05/21/2016 0906   CHOLHDL 7 05/21/2016 0906   VLDL 73.6 (H) 05/21/2016 0906   LDLDIRECT 161.0 05/21/2016 0906

## 2016-05-22 NOTE — Telephone Encounter (Signed)
noted 

## 2016-07-03 DIAGNOSIS — M9903 Segmental and somatic dysfunction of lumbar region: Secondary | ICD-10-CM | POA: Diagnosis not present

## 2016-07-03 DIAGNOSIS — M9902 Segmental and somatic dysfunction of thoracic region: Secondary | ICD-10-CM | POA: Diagnosis not present

## 2016-07-03 DIAGNOSIS — M9905 Segmental and somatic dysfunction of pelvic region: Secondary | ICD-10-CM | POA: Diagnosis not present

## 2016-07-03 DIAGNOSIS — M791 Myalgia: Secondary | ICD-10-CM | POA: Diagnosis not present

## 2016-07-05 DIAGNOSIS — M9902 Segmental and somatic dysfunction of thoracic region: Secondary | ICD-10-CM | POA: Diagnosis not present

## 2016-07-05 DIAGNOSIS — M791 Myalgia: Secondary | ICD-10-CM | POA: Diagnosis not present

## 2016-07-05 DIAGNOSIS — M9903 Segmental and somatic dysfunction of lumbar region: Secondary | ICD-10-CM | POA: Diagnosis not present

## 2016-07-05 DIAGNOSIS — M9905 Segmental and somatic dysfunction of pelvic region: Secondary | ICD-10-CM | POA: Diagnosis not present

## 2016-07-09 DIAGNOSIS — M791 Myalgia: Secondary | ICD-10-CM | POA: Diagnosis not present

## 2016-07-09 DIAGNOSIS — M9905 Segmental and somatic dysfunction of pelvic region: Secondary | ICD-10-CM | POA: Diagnosis not present

## 2016-07-09 DIAGNOSIS — M9902 Segmental and somatic dysfunction of thoracic region: Secondary | ICD-10-CM | POA: Diagnosis not present

## 2016-07-09 DIAGNOSIS — M9903 Segmental and somatic dysfunction of lumbar region: Secondary | ICD-10-CM | POA: Diagnosis not present

## 2016-07-11 DIAGNOSIS — M9902 Segmental and somatic dysfunction of thoracic region: Secondary | ICD-10-CM | POA: Diagnosis not present

## 2016-07-11 DIAGNOSIS — M9903 Segmental and somatic dysfunction of lumbar region: Secondary | ICD-10-CM | POA: Diagnosis not present

## 2016-07-11 DIAGNOSIS — M9905 Segmental and somatic dysfunction of pelvic region: Secondary | ICD-10-CM | POA: Diagnosis not present

## 2016-07-11 DIAGNOSIS — M791 Myalgia: Secondary | ICD-10-CM | POA: Diagnosis not present

## 2016-07-13 DIAGNOSIS — M9902 Segmental and somatic dysfunction of thoracic region: Secondary | ICD-10-CM | POA: Diagnosis not present

## 2016-07-13 DIAGNOSIS — M791 Myalgia: Secondary | ICD-10-CM | POA: Diagnosis not present

## 2016-07-13 DIAGNOSIS — M9903 Segmental and somatic dysfunction of lumbar region: Secondary | ICD-10-CM | POA: Diagnosis not present

## 2016-07-13 DIAGNOSIS — M9905 Segmental and somatic dysfunction of pelvic region: Secondary | ICD-10-CM | POA: Diagnosis not present

## 2016-07-16 DIAGNOSIS — M9903 Segmental and somatic dysfunction of lumbar region: Secondary | ICD-10-CM | POA: Diagnosis not present

## 2016-07-16 DIAGNOSIS — M791 Myalgia: Secondary | ICD-10-CM | POA: Diagnosis not present

## 2016-07-16 DIAGNOSIS — M9905 Segmental and somatic dysfunction of pelvic region: Secondary | ICD-10-CM | POA: Diagnosis not present

## 2016-07-16 DIAGNOSIS — M9902 Segmental and somatic dysfunction of thoracic region: Secondary | ICD-10-CM | POA: Diagnosis not present

## 2016-07-18 DIAGNOSIS — M791 Myalgia: Secondary | ICD-10-CM | POA: Diagnosis not present

## 2016-07-18 DIAGNOSIS — M9905 Segmental and somatic dysfunction of pelvic region: Secondary | ICD-10-CM | POA: Diagnosis not present

## 2016-07-18 DIAGNOSIS — M9903 Segmental and somatic dysfunction of lumbar region: Secondary | ICD-10-CM | POA: Diagnosis not present

## 2016-07-18 DIAGNOSIS — M9902 Segmental and somatic dysfunction of thoracic region: Secondary | ICD-10-CM | POA: Diagnosis not present

## 2016-07-20 DIAGNOSIS — M9905 Segmental and somatic dysfunction of pelvic region: Secondary | ICD-10-CM | POA: Diagnosis not present

## 2016-07-20 DIAGNOSIS — M791 Myalgia: Secondary | ICD-10-CM | POA: Diagnosis not present

## 2016-07-20 DIAGNOSIS — M9902 Segmental and somatic dysfunction of thoracic region: Secondary | ICD-10-CM | POA: Diagnosis not present

## 2016-07-20 DIAGNOSIS — M9903 Segmental and somatic dysfunction of lumbar region: Secondary | ICD-10-CM | POA: Diagnosis not present

## 2016-07-24 DIAGNOSIS — M9905 Segmental and somatic dysfunction of pelvic region: Secondary | ICD-10-CM | POA: Diagnosis not present

## 2016-07-24 DIAGNOSIS — M9903 Segmental and somatic dysfunction of lumbar region: Secondary | ICD-10-CM | POA: Diagnosis not present

## 2016-07-24 DIAGNOSIS — M9902 Segmental and somatic dysfunction of thoracic region: Secondary | ICD-10-CM | POA: Diagnosis not present

## 2016-07-24 DIAGNOSIS — M791 Myalgia: Secondary | ICD-10-CM | POA: Diagnosis not present

## 2016-07-26 DIAGNOSIS — M9903 Segmental and somatic dysfunction of lumbar region: Secondary | ICD-10-CM | POA: Diagnosis not present

## 2016-07-26 DIAGNOSIS — M9905 Segmental and somatic dysfunction of pelvic region: Secondary | ICD-10-CM | POA: Diagnosis not present

## 2016-07-26 DIAGNOSIS — M791 Myalgia: Secondary | ICD-10-CM | POA: Diagnosis not present

## 2016-07-26 DIAGNOSIS — M9902 Segmental and somatic dysfunction of thoracic region: Secondary | ICD-10-CM | POA: Diagnosis not present

## 2016-08-01 DIAGNOSIS — M791 Myalgia: Secondary | ICD-10-CM | POA: Diagnosis not present

## 2016-08-01 DIAGNOSIS — M9905 Segmental and somatic dysfunction of pelvic region: Secondary | ICD-10-CM | POA: Diagnosis not present

## 2016-08-01 DIAGNOSIS — M9902 Segmental and somatic dysfunction of thoracic region: Secondary | ICD-10-CM | POA: Diagnosis not present

## 2016-08-01 DIAGNOSIS — M9903 Segmental and somatic dysfunction of lumbar region: Secondary | ICD-10-CM | POA: Diagnosis not present

## 2016-08-08 DIAGNOSIS — M9902 Segmental and somatic dysfunction of thoracic region: Secondary | ICD-10-CM | POA: Diagnosis not present

## 2016-08-08 DIAGNOSIS — M791 Myalgia: Secondary | ICD-10-CM | POA: Diagnosis not present

## 2016-08-08 DIAGNOSIS — M9903 Segmental and somatic dysfunction of lumbar region: Secondary | ICD-10-CM | POA: Diagnosis not present

## 2016-08-08 DIAGNOSIS — M9905 Segmental and somatic dysfunction of pelvic region: Secondary | ICD-10-CM | POA: Diagnosis not present

## 2016-08-15 DIAGNOSIS — M791 Myalgia: Secondary | ICD-10-CM | POA: Diagnosis not present

## 2016-08-15 DIAGNOSIS — M9901 Segmental and somatic dysfunction of cervical region: Secondary | ICD-10-CM | POA: Diagnosis not present

## 2016-08-15 DIAGNOSIS — M9902 Segmental and somatic dysfunction of thoracic region: Secondary | ICD-10-CM | POA: Diagnosis not present

## 2016-08-20 ENCOUNTER — Ambulatory Visit (INDEPENDENT_AMBULATORY_CARE_PROVIDER_SITE_OTHER): Payer: BLUE CROSS/BLUE SHIELD | Admitting: Family Medicine

## 2016-08-20 ENCOUNTER — Encounter: Payer: Self-pay | Admitting: Family Medicine

## 2016-08-20 VITALS — BP 124/77 | HR 65 | Temp 98.1°F | Resp 20 | Ht 62.0 in | Wt 162.0 lb

## 2016-08-20 DIAGNOSIS — Z0000 Encounter for general adult medical examination without abnormal findings: Secondary | ICD-10-CM | POA: Diagnosis not present

## 2016-08-20 DIAGNOSIS — R928 Other abnormal and inconclusive findings on diagnostic imaging of breast: Secondary | ICD-10-CM

## 2016-08-20 DIAGNOSIS — E785 Hyperlipidemia, unspecified: Secondary | ICD-10-CM | POA: Diagnosis not present

## 2016-08-20 DIAGNOSIS — Z131 Encounter for screening for diabetes mellitus: Secondary | ICD-10-CM

## 2016-08-20 DIAGNOSIS — Z130 Encounter for screening for diseases of the blood and blood-forming organs and certain disorders involving the immune mechanism: Secondary | ICD-10-CM

## 2016-08-20 DIAGNOSIS — D473 Essential (hemorrhagic) thrombocythemia: Secondary | ICD-10-CM

## 2016-08-20 DIAGNOSIS — Z13220 Encounter for screening for lipoid disorders: Secondary | ICD-10-CM

## 2016-08-20 DIAGNOSIS — E559 Vitamin D deficiency, unspecified: Secondary | ICD-10-CM

## 2016-08-20 DIAGNOSIS — K635 Polyp of colon: Secondary | ICD-10-CM

## 2016-08-20 DIAGNOSIS — E663 Overweight: Secondary | ICD-10-CM | POA: Insufficient documentation

## 2016-08-20 DIAGNOSIS — D75839 Thrombocytosis, unspecified: Secondary | ICD-10-CM

## 2016-08-20 DIAGNOSIS — Z6829 Body mass index (BMI) 29.0-29.9, adult: Secondary | ICD-10-CM | POA: Diagnosis not present

## 2016-08-20 DIAGNOSIS — Z1329 Encounter for screening for other suspected endocrine disorder: Secondary | ICD-10-CM

## 2016-08-20 DIAGNOSIS — Z1321 Encounter for screening for nutritional disorder: Secondary | ICD-10-CM | POA: Diagnosis not present

## 2016-08-20 LAB — LIPID PANEL
CHOL/HDL RATIO: 5
CHOLESTEROL: 211 mg/dL — AB (ref 0–200)
HDL: 38.4 mg/dL — ABNORMAL LOW (ref >39.00)
NonHDL: 172.69
Triglycerides: 371 mg/dL — ABNORMAL HIGH (ref 0.0–149.0)
VLDL: 74.2 mg/dL — AB (ref 0.0–40.0)

## 2016-08-20 LAB — COMPREHENSIVE METABOLIC PANEL
ALT: 38 U/L — ABNORMAL HIGH (ref 0–35)
AST: 24 U/L (ref 0–37)
Albumin: 4.7 g/dL (ref 3.5–5.2)
Alkaline Phosphatase: 52 U/L (ref 39–117)
BILIRUBIN TOTAL: 0.5 mg/dL (ref 0.2–1.2)
BUN: 6 mg/dL (ref 6–23)
CO2: 27 meq/L (ref 19–32)
Calcium: 10 mg/dL (ref 8.4–10.5)
Chloride: 103 mEq/L (ref 96–112)
Creatinine, Ser: 0.71 mg/dL (ref 0.40–1.20)
GFR: 90.6 mL/min (ref >60.00)
GLUCOSE: 97 mg/dL (ref 70–99)
Potassium: 4.6 mEq/L (ref 3.5–5.1)
Sodium: 137 mEq/L (ref 135–145)
Total Protein: 7 g/dL (ref 6.0–8.3)

## 2016-08-20 LAB — CBC WITH DIFFERENTIAL/PLATELET
BASOS ABS: 0.1 10*3/uL (ref 0.0–0.1)
Basophils Relative: 2.1 % (ref 0.0–3.0)
EOS ABS: 0.2 10*3/uL (ref 0.0–0.7)
Eosinophils Relative: 4 % (ref 0.0–5.0)
HCT: 40.3 % (ref 36.0–46.0)
Hemoglobin: 13.7 g/dL (ref 12.0–15.0)
LYMPHS ABS: 2.3 10*3/uL (ref 0.7–4.0)
Lymphocytes Relative: 39.6 % (ref 12.0–46.0)
MCHC: 33.9 g/dL (ref 30.0–36.0)
MCV: 88.7 fl (ref 78.0–100.0)
MONO ABS: 0.5 10*3/uL (ref 0.1–1.0)
Monocytes Relative: 7.8 % (ref 3.0–12.0)
NEUTROS ABS: 2.7 10*3/uL (ref 1.4–7.7)
NEUTROS PCT: 46.5 % (ref 43.0–77.0)
PLATELETS: 383 10*3/uL (ref 150.0–400.0)
RBC: 4.55 Mil/uL (ref 3.87–5.11)
RDW: 13.1 % (ref 11.5–15.5)
WBC: 5.8 10*3/uL (ref 4.0–10.5)

## 2016-08-20 LAB — LDL CHOLESTEROL, DIRECT: LDL DIRECT: 114 mg/dL

## 2016-08-20 LAB — TSH: TSH: 1.37 u[IU]/mL (ref 0.35–4.50)

## 2016-08-20 LAB — HEMOGLOBIN A1C: Hgb A1c MFr Bld: 5.8 % (ref 4.6–6.5)

## 2016-08-20 LAB — VITAMIN D 25 HYDROXY (VIT D DEFICIENCY, FRACTURES): VITD: 57.12 ng/mL (ref 30.00–100.00)

## 2016-08-20 NOTE — Patient Instructions (Addendum)
Call Dr. Sharlett Iles and ask when you are do for follow up since you had a polyp 10/29/2011--> if 5 year f/u then you are due this summer.   It was great to see you today.  Make sure to Call Dr. Sharlett Iles to check on follow up.  Make sure to continue self breast exams and follow up in 6 months with diagnostic mammogram.   Health Maintenance, Female Adopting a healthy lifestyle and getting preventive care can go a long way to promote health and wellness. Talk with your health care provider about what schedule of regular examinations is right for you. This is a good chance for you to check in with your provider about disease prevention and staying healthy. In between checkups, there are plenty of things you can do on your own. Experts have done a lot of research about which lifestyle changes and preventive measures are most likely to keep you healthy. Ask your health care provider for more information. Weight and diet Eat a healthy diet  Be sure to include plenty of vegetables, fruits, low-fat dairy products, and lean protein.  Do not eat a lot of foods high in solid fats, added sugars, or salt.  Get regular exercise. This is one of the most important things you can do for your health.  Most adults should exercise for at least 150 minutes each week. The exercise should increase your heart rate and make you sweat (moderate-intensity exercise).  Most adults should also do strengthening exercises at least twice a week. This is in addition to the moderate-intensity exercise. Maintain a healthy weight  Body mass index (BMI) is a measurement that can be used to identify possible weight problems. It estimates body fat based on height and weight. Your health care provider can help determine your BMI and help you achieve or maintain a healthy weight.  For females 60 years of age and older:  A BMI below 18.5 is considered underweight.  A BMI of 18.5 to 24.9 is normal.  A BMI of 25 to 29.9 is  considered overweight.  A BMI of 30 and above is considered obese. Watch levels of cholesterol and blood lipids  You should start having your blood tested for lipids and cholesterol at 56 years of age, then have this test every 5 years.  You may need to have your cholesterol levels checked more often if:  Your lipid or cholesterol levels are high.  You are older than 56 years of age.  You are at high risk for heart disease. Cancer screening Lung Cancer  Lung cancer screening is recommended for adults 67-2 years old who are at high risk for lung cancer because of a history of smoking.  A yearly low-dose CT scan of the lungs is recommended for people who:  Currently smoke.  Have quit within the past 15 years.  Have at least a 30-pack-year history of smoking. A pack year is smoking an average of one pack of cigarettes a day for 1 year.  Yearly screening should continue until it has been 15 years since you quit.  Yearly screening should stop if you develop a health problem that would prevent you from having lung cancer treatment. Breast Cancer  Practice breast self-awareness. This means understanding how your breasts normally appear and feel.  It also means doing regular breast self-exams. Let your health care provider know about any changes, no matter how small.  If you are in your 20s or 30s, you should have a clinical breast  exam (CBE) by a health care provider every 1-3 years as part of a regular health exam.  If you are 21 or older, have a CBE every year. Also consider having a breast X-ray (mammogram) every year.  If you have a family history of breast cancer, talk to your health care provider about genetic screening.  If you are at high risk for breast cancer, talk to your health care provider about having an MRI and a mammogram every year.  Breast cancer gene (BRCA) assessment is recommended for women who have family members with BRCA-related cancers. BRCA-related  cancers include:  Breast.  Ovarian.  Tubal.  Peritoneal cancers.  Results of the assessment will determine the need for genetic counseling and BRCA1 and BRCA2 testing. Cervical Cancer  Your health care provider may recommend that you be screened regularly for cancer of the pelvic organs (ovaries, uterus, and vagina). This screening involves a pelvic examination, including checking for microscopic changes to the surface of your cervix (Pap test). You may be encouraged to have this screening done every 3 years, beginning at age 58.  For women ages 71-65, health care providers may recommend pelvic exams and Pap testing every 3 years, or they may recommend the Pap and pelvic exam, combined with testing for human papilloma virus (HPV), every 5 years. Some types of HPV increase your risk of cervical cancer. Testing for HPV may also be done on women of any age with unclear Pap test results.  Other health care providers may not recommend any screening for nonpregnant women who are considered low risk for pelvic cancer and who do not have symptoms. Ask your health care provider if a screening pelvic exam is right for you.  If you have had past treatment for cervical cancer or a condition that could lead to cancer, you need Pap tests and screening for cancer for at least 20 years after your treatment. If Pap tests have been discontinued, your risk factors (such as having a new sexual partner) need to be reassessed to determine if screening should resume. Some women have medical problems that increase the chance of getting cervical cancer. In these cases, your health care provider may recommend more frequent screening and Pap tests. Colorectal Cancer  This type of cancer can be detected and often prevented.  Routine colorectal cancer screening usually begins at 56 years of age and continues through 56 years of age.  Your health care provider may recommend screening at an earlier age if you have risk  factors for colon cancer.  Your health care provider may also recommend using home test kits to check for hidden blood in the stool.  A small camera at the end of a tube can be used to examine your colon directly (sigmoidoscopy or colonoscopy). This is done to check for the earliest forms of colorectal cancer.  Routine screening usually begins at age 9.  Direct examination of the colon should be repeated every 5-10 years through 56 years of age. However, you may need to be screened more often if early forms of precancerous polyps or small growths are found. Skin Cancer  Check your skin from head to toe regularly.  Tell your health care provider about any new moles or changes in moles, especially if there is a change in a mole's shape or color.  Also tell your health care provider if you have a mole that is larger than the size of a pencil eraser.  Always use sunscreen. Apply sunscreen liberally and  repeatedly throughout the day.  Protect yourself by wearing long sleeves, pants, a wide-brimmed hat, and sunglasses whenever you are outside. Heart disease, diabetes, and high blood pressure  High blood pressure causes heart disease and increases the risk of stroke. High blood pressure is more likely to develop in:  People who have blood pressure in the high end of the normal range (130-139/85-89 mm Hg).  People who are overweight or obese.  People who are African American.  If you are 18-39 years of age, have your blood pressure checked every 3-5 years. If you are 40 years of age or older, have your blood pressure checked every year. You should have your blood pressure measured twice-once when you are at a hospital or clinic, and once when you are not at a hospital or clinic. Record the average of the two measurements. To check your blood pressure when you are not at a hospital or clinic, you can use:  An automated blood pressure machine at a pharmacy.  A home blood pressure  monitor.  If you are between 55 years and 79 years old, ask your health care provider if you should take aspirin to prevent strokes.  Have regular diabetes screenings. This involves taking a blood sample to check your fasting blood sugar level.  If you are at a normal weight and have a low risk for diabetes, have this test once every three years after 56 years of age.  If you are overweight and have a high risk for diabetes, consider being tested at a younger age or more often. Preventing infection Hepatitis B  If you have a higher risk for hepatitis B, you should be screened for this virus. You are considered at high risk for hepatitis B if:  You were born in a country where hepatitis B is common. Ask your health care provider which countries are considered high risk.  Your parents were born in a high-risk country, and you have not been immunized against hepatitis B (hepatitis B vaccine).  You have HIV or AIDS.  You use needles to inject street drugs.  You live with someone who has hepatitis B.  You have had sex with someone who has hepatitis B.  You get hemodialysis treatment.  You take certain medicines for conditions, including cancer, organ transplantation, and autoimmune conditions. Hepatitis C  Blood testing is recommended for:  Everyone born from 1945 through 1965.  Anyone with known risk factors for hepatitis C. Sexually transmitted infections (STIs)  You should be screened for sexually transmitted infections (STIs) including gonorrhea and chlamydia if:  You are sexually active and are younger than 56 years of age.  You are older than 56 years of age and your health care provider tells you that you are at risk for this type of infection.  Your sexual activity has changed since you were last screened and you are at an increased risk for chlamydia or gonorrhea. Ask your health care provider if you are at risk.  If you do not have HIV, but are at risk, it may be  recommended that you take a prescription medicine daily to prevent HIV infection. This is called pre-exposure prophylaxis (PrEP). You are considered at risk if:  You are sexually active and do not regularly use condoms or know the HIV status of your partner(s).  You take drugs by injection.  You are sexually active with a partner who has HIV. Talk with your health care provider about whether you are at high risk   of being infected with HIV. If you choose to begin PrEP, you should first be tested for HIV. You should then be tested every 3 months for as long as you are taking PrEP. Pregnancy  If you are premenopausal and you may become pregnant, ask your health care provider about preconception counseling.  If you may become pregnant, take 400 to 800 micrograms (mcg) of folic acid every day.  If you want to prevent pregnancy, talk to your health care provider about birth control (contraception). Osteoporosis and menopause  Osteoporosis is a disease in which the bones lose minerals and strength with aging. This can result in serious bone fractures. Your risk for osteoporosis can be identified using a bone density scan.  If you are 29 years of age or older, or if you are at risk for osteoporosis and fractures, ask your health care provider if you should be screened.  Ask your health care provider whether you should take a calcium or vitamin D supplement to lower your risk for osteoporosis.  Menopause may have certain physical symptoms and risks.  Hormone replacement therapy may reduce some of these symptoms and risks. Talk to your health care provider about whether hormone replacement therapy is right for you. Follow these instructions at home:  Schedule regular health, dental, and eye exams.  Stay current with your immunizations.  Do not use any tobacco products including cigarettes, chewing tobacco, or electronic cigarettes.  If you are pregnant, do not drink alcohol.  If you are  breastfeeding, limit how much and how often you drink alcohol.  Limit alcohol intake to no more than 1 drink per day for nonpregnant women. One drink equals 12 ounces of beer, 5 ounces of wine, or 1 ounces of hard liquor.  Do not use street drugs.  Do not share needles.  Ask your health care provider for help if you need support or information about quitting drugs.  Tell your health care provider if you often feel depressed.  Tell your health care provider if you have ever been abused or do not feel safe at home. This information is not intended to replace advice given to you by your health care provider. Make sure you discuss any questions you have with your health care provider. Document Released: 11/13/2010 Document Revised: 10/06/2015 Document Reviewed: 02/01/2015 [employer]vier Interactive Patient Education  2017 [employer].

## 2016-08-20 NOTE — Progress Notes (Signed)
Patient ID: Sarah Velazquez, female  DOB: 01-07-1961, 56 y.o.   MRN: [id number] Patient Care Team    Relationship Specialty Notifications Start End  Ma Hillock, DO PCP - General Family Medicine  08/03/15   Sable Feil, MD Consulting Physician Gastroenterology  08/03/15     Subjective:  Sarah Velazquez is a 56 y.o.  Female  present for CPE. All past medical history, surgical history, allergies, family history, immunizations, medications and social history were updated  in the electronic medical record today. All recent labs, ED visits and hospitalizations within the last year were reviewed.   She has started vegan diet and is doing well. She has now been on this diet for 12 weeks.    Health maintenance:  Colonoscopy: hyperplastic polyp 2013, no FHX of colon cancer, mother with colon polyps. F/U 5-10 years unclear. Encouraged pt to call next year (5 year mark and discuss with GI). Verl Blalock, MD.  Mammogram: Diagnostic Repeat with [place] due 10/2016 for 6 month rpt., no FHX. Cervical cancer screening: Last PAP 09/30/2015, normal, neg co-testing completed.  Immunizations: Td UTD 2013, declined influenza Infectious disease screening: HIV and Hep C completed 09/30/2015 DEXA: 60-65 screen, estrogen deficient, vit d deficient.  Assistive device: no Oxygen use:no  Patient has a Dental home. Hospitalizations/ED visits: Reviewed if applicable.   Depression screen PHQ 2/9 08/20/2016  Decreased Interest 0  Down, Depressed, Hopeless 0  PHQ - 2 Score 0    Current Exercise Habits: Structured exercise class, Type of exercise: yoga;strength training/weights, Time (Minutes): 60, Frequency (Times/Week): 7, Weekly Exercise (Minutes/Week): 420, Intensity: Moderate     Immunization History  Administered Date(s) Administered  . Influenza Split 02/12/2012  . Influenza Whole 02/12/2011  . Tdap 09/13/2011     Past Medical History:  Diagnosis Date  . Allergy    seasonal, dogs, cats, horses, hay  .  Arthritis   . Asthma   . Bell's palsy in high school   right  . Cervical cancer screening 04/08/2012  . Chicken pox as a child  . History of neutropenia    age 50-18, received gamma globulin monthly; with multiple hosp admisson for illness (per pt)  . Hyperglycemia   . Hyperlipidemia   . Hyperplastic colon polyp 04/09/2012  . Impetigo as a kid   right face  . Lipoma    growing slowly since 20s  . Menopause 2007  . Migraines   . Mumps as a child  . Preventative health care 09/13/2011  . Shingles    right cheek  . Thrombocytosis ([place]) 04/09/2012  . Vitamin D deficiency    Allergies  Allergen Reactions  . Sulfa Antibiotics    Past Surgical History:  Procedure Laterality Date  . CESAREAN SECTION  05-10-87  . cyst removed     twice on back and 1 on foot  . DILATION AND CURETTAGE OF UTERUS  1989   incomplete miscarriage with second pregnancy and again post menopausal  . INJECTION KNEE Bilateral 09/29/15   Gel  . TONSILLECTOMY  as a child   Family History  Problem Relation Age of Onset  . Stroke Mother     X 3  . Heart attack Mother   . Diabetes Mother     type 2  . Anxiety disorder Mother   . Hypertension Mother   . Hyperlipidemia Mother   . Heart disease Mother   . Cancer Mother     breast, skin  . Colon polyps Mother   .  Breast cancer Mother   . Stroke Father     several  . Heart attack Father     several  . Kidney disease Father     on dialysis  . Hypertension Father   . Hyperlipidemia Father   . Cancer Father     mouth X 2/ chewed tobacco, and skin  . Anxiety disorder Father   . Colon polyps Father   . Squamous cell carcinoma Father   . Hypertension Brother   . Hyperlipidemia Brother   . Depression Brother   . Cancer Maternal Grandmother     breast  . Diabetes Maternal Grandmother     type 2  . Obesity Maternal Grandmother   . Breast cancer Maternal Grandmother   . Cancer Paternal Grandmother     lung/ didn't smoke  . Heart attack Paternal  Grandfather   . Lung cancer Paternal Grandfather    Social History   Social History  . Marital status: Married    Spouse name: N/A  . Number of children: N/A  . Years of education: N/A   Occupational History  . Not on file.   Social History Main Topics  . Smoking status: Never Smoker  . Smokeless tobacco: Never Used  . Alcohol use 1.8 oz/week    3 Glasses of wine per week     Comment: occasionally  . Drug use: No  . Sexual activity: Yes   Other Topics Concern  . Not on file   Social History Narrative   Married to [place]. Has 3 children Jaci [place], [place], [place])   Bachelors of science, homemaker.   Drinks caffeinated beverages, uses herbal remedies, takes a daily vitamin.   Wears her seatbelt, bicycle helmet and exercise is greater than 3 times a week.   Patient eats vegetarian meals 5 days a week, minimal meat product.   Smoke detector in the home, firearms in the home.   Feels safe in her relationships.      Allergies as of 08/20/2016      Reactions   Sulfa Antibiotics       Medication List       Accurate as of 08/20/16  9:01 AM. Always use your most recent med list.          co-enzyme Q-10 30 MG capsule Take 30 mg by mouth 3 (three) times daily.   glucosamine-chondroitin 500-400 MG tablet Take 1 tablet by mouth 3 (three) times daily.   Omega 3 1200 MG Caps Take by mouth.   PROBIOTIC ADVANCED PO Take by mouth.   RED YEAST RICE PO Take by mouth.   Vitamin D 2000 units Caps Take by mouth daily.   vitamin E 200 UNIT capsule Take 200 Units by mouth daily.        No results found for this or any previous visit (from the past 2160 hour(s)).  US Breast Ltd Uni Left Inc Axilla  Result Date: 05/08/2016 CLINICAL DATA:  Six month re-evaluation of probably benign left breast finding. EXAM: 2D DIGITAL DIAGNOSTIC LEFT MAMMOGRAM WITH CAD AND ADJUNCT TOMO ULTRASOUND LEFT BREAST COMPARISON:  Previous exam(s). ACR Breast Density Category b: There are scattered  areas of fibroglandular density. FINDINGS: The left breast parenchymal pattern is stable. There is no worrisome mass, distortion, or worrisome calcification. Mammographic images were processed with CAD. Targeted ultrasound is performed, showing a small benign-appearing cyst located within the left breast at the 9 o'clock position 7 cm from the nipple measuring 3 x 3 x 1 mm  in size. A second tiny adjacent benign-appearing cyst is present within the left breast at the 9 o'clock position 6 cm from the nipple measuring 2 mm in size. These are stable. There are no additional findings. IMPRESSION: Stable left breast parenchymal pattern and stable tiny benign-appearing cysts located within the left breast at 9 o'clock position as discussed above. Recommend bilateral diagnostic mammography and possibly left breast ultrasound in 6 months. RECOMMENDATION: Bilateral diagnostic mammography and possibly left breast ultrasound in 6 months. I have discussed the findings and recommendations with the patient. Results were also provided in writing at the conclusion of the visit. If applicable, a reminder letter will be sent to the patient regarding the next appointment. BI-RADS CATEGORY  3: Probably benign. Electronically Signed   By: Altamese Cabal M.D.   On: 05/08/2016 11:24   Mm Diag Breast Tomo Uni Left  Result Date: 05/08/2016 CLINICAL DATA:  Six month re-evaluation of probably benign left breast finding. EXAM: 2D DIGITAL DIAGNOSTIC LEFT MAMMOGRAM WITH CAD AND ADJUNCT TOMO ULTRASOUND LEFT BREAST COMPARISON:  Previous exam(s). ACR Breast Density Category b: There are scattered areas of fibroglandular density. FINDINGS: The left breast parenchymal pattern is stable. There is no worrisome mass, distortion, or worrisome calcification. Mammographic images were processed with CAD. Targeted ultrasound is performed, showing a small benign-appearing cyst located within the left breast at the 9 o'clock position 7 cm from the  nipple measuring 3 x 3 x 1 mm in size. A second tiny adjacent benign-appearing cyst is present within the left breast at the 9 o'clock position 6 cm from the nipple measuring 2 mm in size. These are stable. There are no additional findings. IMPRESSION: Stable left breast parenchymal pattern and stable tiny benign-appearing cysts located within the left breast at 9 o'clock position as discussed above. Recommend bilateral diagnostic mammography and possibly left breast ultrasound in 6 months. RECOMMENDATION: Bilateral diagnostic mammography and possibly left breast ultrasound in 6 months. I have discussed the findings and recommendations with the patient. Results were also provided in writing at the conclusion of the visit. If applicable, a reminder letter will be sent to the patient regarding the next appointment. BI-RADS CATEGORY  3: Probably benign. Electronically Signed   By: Altamese Cabal M.D.   On: 05/08/2016 11:24     ROS: 14 pt review of systems performed and negative (unless mentioned in an HPI)  Objective: BP 124/77 (BP Location: Left Arm, Patient Position: Sitting, Cuff Size: Large)   Pulse 65   Temp 98.1 F (36.7 C)   Resp 20   Ht _0  (1.575 m)   Wt 162 lb (73.5 kg)   SpO2 98%   BMI 29.63 kg/m  Gen: Afebrile. No acute distress. Nontoxic in appearance, well-developed, well-nourished,  Very pleasant caucasian female.  HENT: AT. [place]. Bilateral TM visualized and normal in appearance, normal external auditory canal. MMM, no oral lesions, adequate dentition. Bilateral nares within normal limits. Throat without erythema, ulcerations or exudates. no Cough on exam, no hoarseness on exam. Eyes:Pupils Equal Round Reactive to light, Extraocular movements intact,  Conjunctiva without redness, discharge or icterus. Neck/lymp/endocrine: Supple,no lymphadenopathy, no thyromegaly CV: RRR no murmur, no edema, +2/4 P posterior tibialis pulses. no carotid bruits. No JVD. Chest: CTAB, no wheeze,  rhonchi or crackles. normal Respiratory effort. good Air movement. Abd: Soft. flart. NTND. BS present. no Masses palpated. No hepatosplenomegaly. No rebound tenderness or guarding. Skin: no rashes, purpura or petechiae. Warm and well-perfused. Skin intact. Neuro/Msk:  Normal gait. PERLA.  EOMi. Alert. Oriented x3.  Cranial nerves II through XII intact. Muscle strength 5/5 upper/lower extremity. DTRs equal bilaterally. Psych: Normal affect, dress and demeanor. Normal speech. Normal thought content and judgment. Breasts: breasts appear normal, symmetrical, no tenderness on exam, no suspicious masses, no skin or nipple changes or axillary nodes.   Assessment/plan: Sarah Velazquez is a 56 y.o. female present for CPE. Screening for iron deficiency anemia/Thrombocytosis ([place]) - CBC w/Diff Encounter for vitamin deficiency screening/Vitamin D deficiency - now taking lower dose D3 from last check.  - Vitamin D (25 hydroxy) Screening for diabetes mellitus - HgB A1c Lipid screening/hyperlipiemia - has started vegan diet and taking fish oil/red yeast rice.  - Lipid panel Screening for thyroid disorder - TSH Encounter for preventive health examination BMI 29.0-29.9,adult - Comp Met (CMET) Hyperplastic colonic polyp, unspecified part of colon Abnormal mammogram Patient was encouraged to exercise greater than 150 minutes a week. Patient was encouraged to choose a diet filled with fresh fruits and vegetables, and lean meats. AVS provided to patient today for education/recommendation on gender specific health and safety maintenance. Colonoscopy: hyperplastic polyp 2013, no FHX of colon cancer, mother with colon polyps. F/U 5-10 years unclear. Encouraged pt to call next year (5 year mark and discuss with GI). Verl Blalock, MD.  Mammogram: Diagnostic Repeat with [place] due 10/2016 for 6 month rpt., no FHX. Cervical cancer screening: Last PAP 09/30/2015, normal, neg co-testing completed.  Immunizations: Td UTD 2013,  declined influenza Infectious disease screening: HIV and Hep C completed 09/30/2015 DEXA: 60-65 screen, estrogen deficient, vit d deficient.  Return in about 1 year (around 08/20/2017) for CPE.  Electronically signed by: Howard Pouch, DO [place]

## 2016-08-21 ENCOUNTER — Telehealth: Payer: Self-pay | Admitting: Family Medicine

## 2016-08-21 DIAGNOSIS — E7889 Other lipoprotein metabolism disorders: Secondary | ICD-10-CM

## 2016-08-21 NOTE — Telephone Encounter (Signed)
noted 

## 2016-08-21 NOTE — Telephone Encounter (Signed)
Spoke with patient reviewed lab results and information with patient. She would like to continue with diet and exercise and retest in 3 months. Patient has scheduled appt for repeat lab for 12/12/16. Lab order placed as future,

## 2016-08-21 NOTE — Telephone Encounter (Signed)
Please call pt: - Most of her labs are stable and look good  - her vit d is perfect now (57). - Her cholesterol panel improved in some ways. She has been working really hard on this... Her changes of diet and exercise definitely shows. Her total and LDL ("bad") cholesterol have improved greatly. However her triglycerides are still high at 371- which is basically unchanged from prior).  Lowering triglycerides are not that easy to do and many times when someone is already exercising > 150 minutes a week and eating healthy they can remain high if there is a family/genetic component to it. Try to lower the saturated fat, trans fat, and cholesterol in your diet. Cutting back on carbohydrates will help, too. Drink less  Beer, wine, and liquor can raise levels. Increase fiber! - There is a medication we could start to help with triglycerides only and is fiber based, instead of a statin (which she has refused in the past). If she would like to wait since she just stopped wine 4 weeks ago and test in another 3-6 months, we could do that or start med and retest in 3 months. Please advise.

## 2016-08-22 DIAGNOSIS — M9902 Segmental and somatic dysfunction of thoracic region: Secondary | ICD-10-CM | POA: Diagnosis not present

## 2016-08-22 DIAGNOSIS — M791 Myalgia: Secondary | ICD-10-CM | POA: Diagnosis not present

## 2016-08-22 DIAGNOSIS — M9901 Segmental and somatic dysfunction of cervical region: Secondary | ICD-10-CM | POA: Diagnosis not present

## 2016-09-24 ENCOUNTER — Encounter: Payer: Self-pay | Admitting: Family Medicine

## 2016-09-24 ENCOUNTER — Ambulatory Visit (INDEPENDENT_AMBULATORY_CARE_PROVIDER_SITE_OTHER): Payer: BLUE CROSS/BLUE SHIELD | Admitting: Family Medicine

## 2016-09-24 VITALS — BP 122/76 | HR 95 | Temp 98.0°F | Resp 16 | Wt 157.0 lb

## 2016-09-24 DIAGNOSIS — R059 Cough, unspecified: Secondary | ICD-10-CM

## 2016-09-24 DIAGNOSIS — R05 Cough: Secondary | ICD-10-CM | POA: Diagnosis not present

## 2016-09-24 DIAGNOSIS — J40 Bronchitis, not specified as acute or chronic: Secondary | ICD-10-CM | POA: Diagnosis not present

## 2016-09-24 MED ORDER — AMOXICILLIN-POT CLAVULANATE 875-125 MG PO TABS: 1.0000 | ORAL_TABLET | Freq: Two times a day (BID) | ORAL | 0 refills | Status: DC

## 2016-09-24 MED ORDER — PREDNISONE 50 MG PO TABS: 50.0000 mg | ORAL_TABLET | Freq: Every day | ORAL | 0 refills | Status: DC

## 2016-09-24 MED ORDER — BENZONATATE 100 MG PO CAPS: 100.0000 mg | ORAL_CAPSULE | Freq: Two times a day (BID) | ORAL | 0 refills | Status: DC | PRN

## 2016-09-24 NOTE — Patient Instructions (Signed)
Augmentin every 12 hr with food.  Prednisone daily for 5 days with food. Tessalon perles twice a day for cough.   Mucinex DM for cough--> over the counter.     Acute Bronchitis, Adult Acute bronchitis is when air tubes (bronchi) in the lungs suddenly get swollen. The condition can make it hard to breathe. It can also cause these symptoms:  A cough.  Coughing up clear, yellow, or green mucus.  Wheezing.  Chest congestion.  Shortness of breath.  A fever.  Body aches.  Chills.  A sore throat. Follow these instructions at home: Medicines   Take over-the-counter and prescription medicines only as told by your doctor.  If you were prescribed an antibiotic medicine, take it as told by your doctor. Do not stop taking the antibiotic even if you start to feel better. General instructions   Rest.  Drink enough fluids to keep your pee (urine) clear or pale yellow.  Avoid smoking and secondhand smoke. If you smoke and you need help quitting, ask your doctor. Quitting will help your lungs heal faster.  Use an inhaler, cool mist vaporizer, or humidifier as told by your doctor.  Keep all follow-up visits as told by your doctor. This is important. How is this prevented? To lower your risk of getting this condition again:  Wash your hands often with soap and water. If you cannot use soap and water, use hand sanitizer.  Avoid contact with people who have cold symptoms.  Try not to touch your hands to your mouth, nose, or eyes.  Make sure to get the flu shot every year. Contact a doctor if:  Your symptoms do not get better in 2 weeks. Get help right away if:  You cough up blood.  You have chest pain.  You have very bad shortness of breath.  You become dehydrated.  You faint (pass out) or keep feeling like you are going to pass out.  You keep throwing up (vomiting).  You have a very bad headache.  Your fever or chills gets worse. This information is not intended  to replace advice given to you by your health care provider. Make sure you discuss any questions you have with your health care provider. Document Released: 10/17/2007 Document Revised: 12/07/2015 Document Reviewed: 10/19/2015 Elsevier Interactive Patient Education  2017 [employer].

## 2016-09-24 NOTE — Progress Notes (Signed)
Sarah Velazquez, Sarah Velazquez January 29, 1961, 56 y.o., female MRN: [id number] Patient Care Team    Relationship Specialty Notifications Start End  Ma Hillock, DO PCP - General Family Medicine  08/03/15   Sable Feil, MD Consulting Physician Gastroenterology  08/03/15     Chief Complaint  Patient presents with  . Cough    dry cough x 1 week     Subjective: Pt presents for an OV with complaints of dry cough of 1 week duration duration.  Associated symptoms include sore throat (R>L), hoarseness, rash on chest, rattling in chest. She denies fever, chills, nausea, vomit, sinus headache, rhinorrhea or fatigue. She reports allergies started last week, she thought they improved but Thursday night her other symptoms started. She did fly from [place] Thursday as well.   Pt has tried Human resources officer D to ease their symptoms.   Depression screen PHQ 2/9 08/20/2016  Decreased Interest 0  Down, Depressed, Hopeless 0  PHQ - 2 Score 0    Allergies  Allergen Reactions  . Sulfa Antibiotics    Social History  Substance Use Topics  . Smoking status: Never Smoker  . Smokeless tobacco: Never Used  . Alcohol use 1.8 oz/week    3 Glasses of wine per week     Comment: occasionally   Past Medical History:  Diagnosis Date  . Allergy    seasonal, dogs, cats, horses, hay  . Arthritis   . Asthma   . Bell's palsy in high school   right  . Cervical cancer screening 04/08/2012  . Chicken pox as a child  . History of neutropenia    age [age], received gamma globulin monthly; with multiple hosp admisson for illness (per pt)  . Hyperglycemia   . Hyperlipidemia   . Hyperplastic colon polyp 04/09/2012  . Impetigo as a kid   right face  . Lipoma    growing slowly since 20s  . Menopause 2007  . Migraines   . Mumps as a child  . Preventative health care 09/13/2011  . Shingles    right cheek  . Thrombocytosis ([place]) 04/09/2012  . Vitamin D deficiency    Past Surgical History:  Procedure Laterality Date  .  CESAREAN SECTION  05-10-87  . cyst removed     twice on back and 1 on foot  . DILATION AND CURETTAGE OF UTERUS  1989   incomplete miscarriage with second pregnancy and again post menopausal  . INJECTION KNEE Bilateral 09/29/15   Gel  . TONSILLECTOMY  as a child   Family History  Problem Relation Age of Onset  . Stroke Mother        X 3  . Heart attack Mother   . Diabetes Mother        type 2  . Anxiety disorder Mother   . Hypertension Mother   . Hyperlipidemia Mother   . Heart disease Mother   . Cancer Mother        breast, skin  . Colon polyps Mother   . Breast cancer Mother   . Stroke Father        several  . Heart attack Father        several  . Kidney disease Father        on dialysis  . Hypertension Father   . Hyperlipidemia Father   . Cancer Father        mouth X 2/ chewed tobacco, and skin  . Anxiety disorder Father   . Colon polyps  Father   . Squamous cell carcinoma Father   . Hypertension Brother   . Hyperlipidemia Brother   . Depression Brother   . Cancer Maternal Grandmother        breast  . Diabetes Maternal Grandmother        type 2  . Obesity Maternal Grandmother   . Breast cancer Maternal Grandmother   . Cancer Paternal Grandmother        lung/ didn't smoke  . Heart attack Paternal Grandfather   . Lung cancer Paternal Grandfather    Allergies as of 09/24/2016      Reactions   Sulfa Antibiotics       Medication List       Accurate as of 09/24/16 10:32 AM. Always use your most recent med list.          Cinnamon 500 MG Tabs Take by mouth 3 (three) times daily.   co-enzyme Q-10 30 MG capsule Take 30 mg by mouth 3 (three) times daily.   glucosamine-chondroitin 500-400 MG tablet Take 1 tablet by mouth 3 (three) times daily.   Omega 3 1200 MG Caps Take by mouth.   PROBIOTIC ADVANCED PO Take by mouth.   RED YEAST RICE PO Take by mouth.   Turmeric 500 MG Caps Take by mouth 3 (three) times daily.   Vitamin D 2000 units Caps Take  by mouth daily.   vitamin E 200 UNIT capsule Take 200 Units by mouth daily.       All past medical history, surgical history, allergies, family history, immunizations andmedications were updated in the EMR today and reviewed under the history and medication portions of their EMR.     ROS: Negative, with the exception of above mentioned in HPI  Objective:  BP 122/76 (BP Location: Left Arm, Patient Position: Sitting, Cuff Size: Normal)   Pulse 95   Temp 98 F (36.7 C) (Oral)   Resp 16   Wt 157 lb (71.2 kg)   SpO2 95%   BMI 28.72 kg/m  Body mass index is 28.72 kg/m. Gen: Afebrile. No acute distress. Nontoxic in appearance, well developed, well nourished.  HENT: AT. [place]. Bilateral TM visualized fullness right. MMM, no oral lesions. Bilateral nares without erythema, swelling or drainage. Throat without erythema or exudates. Cough and hoarseness present.  Eyes:Pupils Equal Round Reactive to light, Extraocular movements intact,  Conjunctiva without redness, discharge or icterus. Neck/lymp/endocrine: Supple,mild bilateral ant cervical  lymphadenopathy CV: RRR  Chest: CTAB, no wheeze or crackles. Good air movement, normal resp effort.  Skin: right sided erythremic rash, no vesicles. No purpura or petechiae.  Neuro: Normal gait. PERLA. EOMi. Alert. Oriented x3   No exam data present No results found. No results found for this or any previous visit (from the past 24 hour(s)).  Assessment/Plan: Atlantis Delong is a 56 y.o. female present for OV for  Cough Bronchitis - Abx, steroids, tessalon perles, Mucinex DM, rest and hydrate.  - amoxicillin-clavulanate (AUGMENTIN) 875-125 MG tablet; Take 1 tablet by mouth 2 (two) times daily.  Dispense: 20 tablet; Refill: 0 - predniSONE (DELTASONE) 50 MG tablet; Take 1 tablet (50 mg total) by mouth daily with breakfast.  Dispense: 5 tablet; Refill: 0 - benzonatate (TESSALON) 100 MG capsule; Take 1 capsule (100 mg total) by mouth 2 (two) times daily as  needed for cough.  Dispense: 20 capsule; Refill: 0 - F/u PRN, sooner if worsening.   Reviewed expectations re: course of current medical issues.  Discussed self-management of symptoms.  Outlined signs and symptoms indicating need for more acute intervention.  Patient verbalized understanding and all questions were answered.  Patient received an After-Visit Summary.   Note is dictated utilizing voice recognition software. Although note has been proof read prior to signing, occasional typographical errors still can be missed. If any questions arise, please do not hesitate to call for verification.   electronically signed by:  Howard Pouch, DO  [place]

## 2016-10-05 ENCOUNTER — Encounter: Payer: Self-pay | Admitting: Internal Medicine

## 2016-11-07 ENCOUNTER — Ambulatory Visit (INDEPENDENT_AMBULATORY_CARE_PROVIDER_SITE_OTHER): Payer: BLUE CROSS/BLUE SHIELD | Admitting: Family Medicine

## 2016-11-07 ENCOUNTER — Encounter: Payer: Self-pay | Admitting: Family Medicine

## 2016-11-07 VITALS — BP 132/79 | HR 60 | Temp 98.1°F | Resp 20 | Wt 152.8 lb

## 2016-11-07 DIAGNOSIS — R05 Cough: Secondary | ICD-10-CM | POA: Diagnosis not present

## 2016-11-07 DIAGNOSIS — J40 Bronchitis, not specified as acute or chronic: Secondary | ICD-10-CM | POA: Diagnosis not present

## 2016-11-07 DIAGNOSIS — H109 Unspecified conjunctivitis: Secondary | ICD-10-CM | POA: Diagnosis not present

## 2016-11-07 DIAGNOSIS — R059 Cough, unspecified: Secondary | ICD-10-CM

## 2016-11-07 MED ORDER — ERYTHROMYCIN 5 MG/GM OP OINT: 1.0000 "application " | TOPICAL_OINTMENT | Freq: Every day | OPHTHALMIC | 0 refills | Status: DC

## 2016-11-07 MED ORDER — AMOXICILLIN-POT CLAVULANATE 875-125 MG PO TABS: 1.0000 | ORAL_TABLET | Freq: Two times a day (BID) | ORAL | 0 refills | Status: DC

## 2016-11-07 NOTE — Patient Instructions (Addendum)
Warm compresses for eye. Ointment at night for about 5 days.  Start Augmentin every 12 hours for 10 days.  Tessalon perles for cough.    I hope you feel better.

## 2016-11-07 NOTE — Progress Notes (Signed)
Sarah Velazquez, [place] 1960-06-22, 56 y.o., female MRN: [id number] Patient Care Team    Relationship Specialty Notifications Start End  Ma Hillock, DO PCP - General Family Medicine  08/03/15   Sable Feil, MD Consulting Physician Gastroenterology  08/03/15     Chief Complaint  Patient presents with  . Eye Pain    right side with drainage,matted shut this am  . URI    fever,headache     Subjective: Pt presents for an OV with complaints of sore throat  of 1 week duration.  Associated symptoms include Right eye drainage, and "sandpaper sensation. Her right eye was matted shut this morning. She is prescribed contacts, but states she has not worn them in over one to 2 weeks prior to the onset of symptoms. She states approximately 7 days ago she noticed she was more fatigued, fever of 102F, sore throat, runny nose, nasal congestion and burning sensation in her chest. She states her husband is also ill, and has been ill for about 2 weeks. He also has an appointment to be seen later today.   Depression screen PHQ 2/9 08/20/2016  Decreased Interest 0  Down, Depressed, Hopeless 0  PHQ - 2 Score 0    Allergies  Allergen Reactions  . Sulfa Antibiotics    Social History  Substance Use Topics  . Smoking status: Never Smoker  . Smokeless tobacco: Never Used  . Alcohol use 1.8 oz/week    3 Glasses of wine per week     Comment: occasionally   Past Medical History:  Diagnosis Date  . Allergy    seasonal, dogs, cats, horses, hay  . Arthritis   . Asthma   . Bell's palsy in high school   right  . Cervical cancer screening 04/08/2012  . Chicken pox as a child  . History of neutropenia    age [age], received gamma globulin monthly; with multiple hosp admisson for illness (per pt)  . Hyperglycemia   . Hyperlipidemia   . Hyperplastic colon polyp 04/09/2012  . Lipoma    growing slowly since 20s  . Menopause 2007  . Migraines   . Mumps as a child  . Shingles    right cheek  .  Thrombocytosis ([place]) 04/09/2012  . Vitamin D deficiency    Past Surgical History:  Procedure Laterality Date  . CESAREAN SECTION  05-10-87  . cyst removed     twice on back and 1 on foot  . DILATION AND CURETTAGE OF UTERUS  1989   incomplete miscarriage with second pregnancy and again post menopausal  . INJECTION KNEE Bilateral 09/29/15   Gel  . TONSILLECTOMY  as a child   Family History  Problem Relation Age of Onset  . Stroke Mother        X 3  . Heart attack Mother   . Diabetes Mother        type 2  . Anxiety disorder Mother   . Hypertension Mother   . Hyperlipidemia Mother   . Heart disease Mother   . Cancer Mother        breast, skin  . Colon polyps Mother   . Breast cancer Mother   . Stroke Father        several  . Heart attack Father        several  . Kidney disease Father        on dialysis  . Hypertension Father   . Hyperlipidemia Father   .  Cancer Father        mouth X 2/ chewed tobacco, and skin  . Anxiety disorder Father   . Colon polyps Father   . Squamous cell carcinoma Father   . Hypertension Brother   . Hyperlipidemia Brother   . Depression Brother   . Cancer Maternal Grandmother        breast  . Diabetes Maternal Grandmother        type 2  . Obesity Maternal Grandmother   . Breast cancer Maternal Grandmother   . Cancer Paternal Grandmother        lung/ didn't smoke  . Heart attack Paternal Grandfather   . Lung cancer Paternal Grandfather    Allergies as of 11/07/2016      Reactions   Sulfa Antibiotics       Medication List       Accurate as of 11/07/16  5:47 PM. Always use your most recent med list.          amoxicillin-clavulanate 875-125 MG tablet Commonly known as:  AUGMENTIN Take 1 tablet by mouth 2 (two) times daily.   Cinnamon 500 MG Tabs Take by mouth 3 (three) times daily.   co-enzyme Q-10 30 MG capsule Take 30 mg by mouth 3 (three) times daily.   erythromycin ophthalmic ointment Place 1 application into the right  eye at bedtime. Thin ribbon nightly for 5 nights.   Omega 3 1200 MG Caps Take by mouth. vegan   PROBIOTIC ADVANCED PO Take by mouth.   RED YEAST RICE PO Take by mouth.   Turmeric 500 MG Caps Take by mouth 3 (three) times daily.   Vitamin D 2000 units Caps Take by mouth daily.       All past medical history, surgical history, allergies, family history, immunizations andmedications were updated in the EMR today and reviewed under the history and medication portions of their EMR.     ROS: Negative, with the exception of above mentioned in HPI   Objective:  BP 132/79 (BP Location: Left Arm, Patient Position: Sitting, Cuff Size: Normal)   Pulse 60   Temp 98.1 F (36.7 C)   Resp 20   Wt 152 lb 12 oz (69.3 kg)   SpO2 98%   BMI 27.94 kg/m  Body mass index is 27.94 kg/m. Gen: Afebrile. No acute distress. Nontoxic in appearance, well developed, well nourished.  HENT: AT. [place]. Bilateral TM visualized no erythema, bilateral fullness. MMM, no oral lesions. Bilateral nares mild erythema, swelling and drainage. Throat without erythema or exudates. No cough on exam. Hoarseness is present. Eyes:Pupils Equal Round Reactive to light, Extraocular movements intact,  Conjunctiva without redness, discharge or icterus. Neck/lymp/endocrine: Supple, no lymphadenopathy CV: RRR , no edema Chest: CTAB, no wheeze or crackles. Good air movement, normal resp effort.  Skin: No rashes, purpura or petechiae.  Neuro:  Normal gait. PERLA. EOMi. Alert. Oriented x3  No exam data present No results found. No results found for this or any previous visit (from the past 24 hour(s)).  Assessment/Plan: Sarah Velazquez is a 56 y.o. female present for OV for  Cough Bronchitis - Rest, hydrate, +/- Mucinex DM. May use prior prescription for Tessalon Perles if needed for cough. - amoxicillin-clavulanate (AUGMENTIN) 875-125 MG tablet; Take 1 tablet by mouth 2 (two) times daily.  Dispense: 20 tablet; Refill: 0 -  Follow-up 2 weeks if not resolved, or sooner if worsening.  Conjunctivitis of right eye, unspecified conjunctivitis type - Very mild case, discussed warm compresses for  the eyes. Do not feel it needs treatment with fluoroquinolone given she has a marked contacts in a few weeks prior to onset. - erythromycin ophthalmic ointment; Place 1 application into the right eye at bedtime. Thin ribbon nightly for 5 nights.  Dispense: 3.5 g; Refill: 0 - Follow-up one week if not improved.   Reviewed expectations re: course of current medical issues.  Discussed self-management of symptoms.  Outlined signs and symptoms indicating need for more acute intervention.  Patient verbalized understanding and all questions were answered.  Patient received an After-Visit Summary.   Note is dictated utilizing voice recognition software. Although note has been proof read prior to signing, occasional typographical errors still can be missed. If any questions arise, please do not hesitate to call for verification.   electronically signed by:  Howard Pouch, DO  [place]

## 2016-12-12 ENCOUNTER — Other Ambulatory Visit (INDEPENDENT_AMBULATORY_CARE_PROVIDER_SITE_OTHER): Payer: BLUE CROSS/BLUE SHIELD

## 2016-12-12 DIAGNOSIS — E7889 Other lipoprotein metabolism disorders: Secondary | ICD-10-CM

## 2016-12-12 LAB — LDL CHOLESTEROL, DIRECT: LDL DIRECT: 142 mg/dL

## 2016-12-12 LAB — LIPID PANEL
CHOL/HDL RATIO: 6
Cholesterol: 219 mg/dL — ABNORMAL HIGH (ref 0–200)
HDL: 34.1 mg/dL — ABNORMAL LOW (ref >39.00)
NONHDL: 185.22
TRIGLYCERIDES: 278 mg/dL — AB (ref 0.0–149.0)
VLDL: 55.6 mg/dL — ABNORMAL HIGH (ref 0.0–40.0)

## 2016-12-17 ENCOUNTER — Telehealth: Payer: Self-pay | Admitting: Family Medicine

## 2016-12-17 NOTE — Telephone Encounter (Signed)
Message left on voice mail for patient to return call. 

## 2016-12-17 NOTE — Telephone Encounter (Signed)
Sarah Velazquez left message for Patient.

## 2016-12-17 NOTE — Telephone Encounter (Signed)
Patient transferred to front desk requesting to schedule lab appt for lipid panel 12 weeks from last lab appt on 12/12/16. Appt was scheduled as requested.  However, patient requesting order to have B12 level checked also so she is now following a vegan diet.

## 2016-12-17 NOTE — Telephone Encounter (Signed)
She will need an appt to recheck labs, and I would suggest repeating in 6 months provider appt (labs ordered at that visit). Thanks.

## 2016-12-17 NOTE — Telephone Encounter (Signed)
Please call pt: Her cholesterol panel is abut the same with more improvement tin her triglycerides from 371--> 278, but it is still higher than desired (below 150 goal).  - She has been not wanting to start prescribed medications for this. We could try the fenofibrate since it is mostly the triglycerides that are not controlled, instead of the statin (which was her main concern). She would continue the fish oil/red yeast rice. - If she is agreeable, will call this in for her.

## 2016-12-17 NOTE — Telephone Encounter (Signed)
Patient notified and verbalized understanding.  Patient wants to hold off of medication for another 12 weeks and see if numbers continue to go down. She wants to recheck labs at that time.

## 2016-12-24 NOTE — Telephone Encounter (Signed)
Detailed message left on patients voice mail. Okay per DPR.

## 2016-12-27 ENCOUNTER — Other Ambulatory Visit: Payer: Self-pay | Admitting: Family Medicine

## 2016-12-27 DIAGNOSIS — N6489 Other specified disorders of breast: Secondary | ICD-10-CM

## 2017-01-03 ENCOUNTER — Ambulatory Visit
Admission: RE | Admit: 2017-01-03 | Discharge: 2017-01-03 | Disposition: A | Discharge status: Home / Self Care | Payer: BLUE CROSS/BLUE SHIELD | Source: Ambulatory Visit | Attending: Family Medicine | Admitting: Family Medicine

## 2017-01-03 DIAGNOSIS — N6489 Other specified disorders of breast: Secondary | ICD-10-CM

## 2017-01-03 DIAGNOSIS — R928 Other abnormal and inconclusive findings on diagnostic imaging of breast: Secondary | ICD-10-CM | POA: Diagnosis not present

## 2017-01-03 DIAGNOSIS — N6002 Solitary cyst of left breast: Secondary | ICD-10-CM | POA: Diagnosis not present

## 2017-03-06 ENCOUNTER — Other Ambulatory Visit: Payer: BLUE CROSS/BLUE SHIELD

## 2017-04-09 ENCOUNTER — Encounter: Payer: Self-pay | Admitting: Internal Medicine

## 2017-05-27 DIAGNOSIS — M9905 Segmental and somatic dysfunction of pelvic region: Secondary | ICD-10-CM | POA: Diagnosis not present

## 2017-05-27 DIAGNOSIS — M5441 Lumbago with sciatica, right side: Secondary | ICD-10-CM | POA: Diagnosis not present

## 2017-05-27 DIAGNOSIS — M9903 Segmental and somatic dysfunction of lumbar region: Secondary | ICD-10-CM | POA: Diagnosis not present

## 2017-05-27 DIAGNOSIS — M9902 Segmental and somatic dysfunction of thoracic region: Secondary | ICD-10-CM | POA: Diagnosis not present

## 2017-05-28 ENCOUNTER — Other Ambulatory Visit: Payer: Self-pay

## 2017-05-28 ENCOUNTER — Ambulatory Visit (AMBULATORY_SURGERY_CENTER): Payer: Self-pay | Admitting: *Deleted

## 2017-05-28 VITALS — Ht 63.0 in | Wt 155.0 lb

## 2017-05-28 DIAGNOSIS — Z8371 Family history of colonic polyps: Secondary | ICD-10-CM

## 2017-05-28 MED ORDER — NA SULFATE-K SULFATE-MG SULF 17.5-3.13-1.6 GM/177ML PO SOLN: 1.0000 | Freq: Once | ORAL | 0 refills | Status: AC

## 2017-05-28 NOTE — Progress Notes (Signed)
No egg or soy allergy known to patient  No issues with past sedation with any surgeries  or procedures, no intubation problems  No diet pills per patient No home 02 use per patient  No blood thinners per patient  Pt denies issues with constipation  No A fib or A flutter  EMMI video sent to pt's e mail - pt declined  Called [facility]- [facility] is covered - pt aware

## 2017-05-29 DIAGNOSIS — M9902 Segmental and somatic dysfunction of thoracic region: Secondary | ICD-10-CM | POA: Diagnosis not present

## 2017-05-29 DIAGNOSIS — M9905 Segmental and somatic dysfunction of pelvic region: Secondary | ICD-10-CM | POA: Diagnosis not present

## 2017-05-29 DIAGNOSIS — M5441 Lumbago with sciatica, right side: Secondary | ICD-10-CM | POA: Diagnosis not present

## 2017-05-29 DIAGNOSIS — M9903 Segmental and somatic dysfunction of lumbar region: Secondary | ICD-10-CM | POA: Diagnosis not present

## 2017-05-31 ENCOUNTER — Encounter: Payer: Self-pay | Admitting: Internal Medicine

## 2017-06-04 DIAGNOSIS — M9902 Segmental and somatic dysfunction of thoracic region: Secondary | ICD-10-CM | POA: Diagnosis not present

## 2017-06-04 DIAGNOSIS — M9903 Segmental and somatic dysfunction of lumbar region: Secondary | ICD-10-CM | POA: Diagnosis not present

## 2017-06-04 DIAGNOSIS — M9905 Segmental and somatic dysfunction of pelvic region: Secondary | ICD-10-CM | POA: Diagnosis not present

## 2017-06-04 DIAGNOSIS — M5441 Lumbago with sciatica, right side: Secondary | ICD-10-CM | POA: Diagnosis not present

## 2017-06-10 ENCOUNTER — Ambulatory Visit (AMBULATORY_SURGERY_CENTER): Payer: BLUE CROSS/BLUE SHIELD | Admitting: Internal Medicine

## 2017-06-10 ENCOUNTER — Other Ambulatory Visit: Payer: Self-pay

## 2017-06-10 ENCOUNTER — Encounter: Payer: Self-pay | Admitting: Internal Medicine

## 2017-06-10 VITALS — BP 121/79 | HR 72 | Temp 98.6°F | Resp 13 | Ht 63.0 in | Wt 155.0 lb

## 2017-06-10 DIAGNOSIS — Z1212 Encounter for screening for malignant neoplasm of rectum: Secondary | ICD-10-CM | POA: Diagnosis not present

## 2017-06-10 DIAGNOSIS — Z1211 Encounter for screening for malignant neoplasm of colon: Secondary | ICD-10-CM

## 2017-06-10 DIAGNOSIS — Z8371 Family history of colonic polyps: Secondary | ICD-10-CM | POA: Diagnosis not present

## 2017-06-10 DIAGNOSIS — D123 Benign neoplasm of transverse colon: Secondary | ICD-10-CM | POA: Diagnosis not present

## 2017-06-10 MED ORDER — SODIUM CHLORIDE 0.9 % IV SOLN: 500.0000 mL | Freq: Once | INTRAVENOUS | Status: DC

## 2017-06-10 NOTE — Op Note (Signed)
[place] Patient Name: Sarah Velazquez Procedure Date: 06/10/2017 12:26 PM MRN: [id number] Endoscopist: Jerene Bears , MD Age: 57 Referring MD:  Date of Birth: 05/01/1961 Gender: Female Account #: [id number] Procedure:                Colonoscopy Indications:              Colon cancer screening in patient at increased                            risk: Family history of colon polyps in multiple                            1st-degree relatives (mother and father), Last                            colonoscopy: 2013; 1 cm hyperplastic polyp removed                            from splenic flexure at 1st exam in 2013 Medicines:                Monitored Anesthesia Care Procedure:                Pre-Anesthesia Assessment:                           - Prior to the procedure, a History and Physical                            was performed, and patient medications and                            allergies were reviewed. The patient's tolerance of                            previous anesthesia was also reviewed. The risks                            and benefits of the procedure and the sedation                            options and risks were discussed with the patient.                            All questions were answered, and informed consent                            was obtained. Prior Anticoagulants: The patient has                            taken no previous anticoagulant or antiplatelet                            agents. ASA Grade Assessment: II - A patient with  mild systemic disease. After reviewing the risks                            and benefits, the patient was deemed in                            satisfactory condition to undergo the procedure.                           After obtaining informed consent, the colonoscope                            was passed under direct vision. Throughout the                            procedure, the patient's blood  pressure, pulse, and                            oxygen saturations were monitored continuously. The                            Colonoscope was introduced through the anus and                            advanced to the the cecum, identified by                            appendiceal orifice and ileocecal valve. The                            colonoscopy was performed without difficulty. The                            patient tolerated the procedure well. The quality                            of the bowel preparation was good. The ileocecal                            valve, appendiceal orifice, and rectum were                            photographed. Scope In: 1:42:48 PM Scope Out: 1:56:49 PM Scope Withdrawal Time: 0 hours 10 minutes 20 seconds  Total Procedure Duration: 0 hours 14 minutes 1 second  Findings:                 The digital rectal exam was normal.                           A 4 mm polyp was found in the transverse colon. The                            polyp was sessile. The polyp was removed with a  cold snare. Resection and retrieval were complete.                           The exam was otherwise without abnormality on                            direct and retroflexion views. Complications:            No immediate complications. Estimated Blood Loss:     Estimated blood loss: none. Impression:               - One 4 mm polyp in the transverse colon, removed                            with a cold snare. Resected and retrieved.                           - The examination was otherwise normal on direct                            and retroflexion views. Recommendation:           - Patient has a contact number available for                            emergencies. The signs and symptoms of potential                            delayed complications were discussed with the                            patient. Return to normal activities tomorrow.                             Written discharge instructions were provided to the                            patient.                           - Resume previous diet.                           - Continue present medications.                           - Await pathology results.                           - Repeat colonoscopy is recommended likely in 5                            years. The colonoscopy date will be determined                            after pathology results from today's exam become  available for review. Jerene Bears, MD 06/10/2017 2:03:24 PM This report has been signed electronically.

## 2017-06-10 NOTE — Progress Notes (Signed)
Pt's states no medical or surgical changes since previsit or office visit. 

## 2017-06-10 NOTE — Patient Instructions (Signed)
Handout given for polyps.  YOU HAD AN ENDOSCOPIC PROCEDURE TODAY AT THE [facility]:   Refer to the procedure report that was given to you for any specific questions about what was found during the examination.  If the procedure report does not answer your questions, please call your gastroenterologist to clarify.  If you requested that your care partner not be given the details of your procedure findings, then the procedure report has been included in a sealed envelope for you to review at your convenience later.  YOU SHOULD EXPECT: Some feelings of bloating in the abdomen. Passage of more gas than usual.  Walking can help get rid of the air that was put into your GI tract during the procedure and reduce the bloating. If you had a lower endoscopy (such as a colonoscopy or flexible sigmoidoscopy) you may notice spotting of blood in your stool or on the toilet paper. If you underwent a bowel prep for your procedure, you may not have a normal bowel movement for a few days.  Please Note:  You might notice some irritation and congestion in your nose or some drainage.  This is from the oxygen used during your procedure.  There is no need for concern and it should clear up in a day or so.  SYMPTOMS TO REPORT IMMEDIATELY:   Following lower endoscopy (colonoscopy or flexible sigmoidoscopy):  Excessive amounts of blood in the stool  Significant tenderness or worsening of abdominal pains  Swelling of the abdomen that is new, acute  Fever of 100F or higher  For urgent or emergent issues, a gastroenterologist can be reached at any hour by calling [phone].   DIET:  We do recommend a small meal at first, but then you may proceed to your regular diet.  Drink plenty of fluids but you should avoid alcoholic beverages for 24 hours.  ACTIVITY:  You should plan to take it easy for the rest of today and you should NOT DRIVE or use heavy machinery until tomorrow (because of the sedation  medicines used during the test).    FOLLOW UP: Our staff will call the number listed on your records the next business day following your procedure to check on you and address any questions or concerns that you may have regarding the information given to you following your procedure. If we do not reach you, we will leave a message.  However, if you are feeling well and you are not experiencing any problems, there is no need to return our call.  We will assume that you have returned to your regular daily activities without incident.  If any biopsies were taken you will be contacted by phone or by letter within the next 1-3 weeks.  Please call us at [phone] if you have not heard about the biopsies in 3 weeks.    SIGNATURES/CONFIDENTIALITY: You and/or your care partner have signed paperwork which will be entered into your electronic medical record.  These signatures attest to the fact that that the information above on your After Visit Summary has been reviewed and is understood.  Full responsibility of the confidentiality of this discharge information lies with you and/or your care-partner. 

## 2017-06-10 NOTE — Progress Notes (Signed)
Called to room to assist during endoscopic procedure.  Patient ID and intended procedure confirmed with present staff. Received instructions for my participation in the procedure from the performing physician.  

## 2017-06-10 NOTE — Progress Notes (Signed)
Report to PACU, RN, vss, BBS= Clear.  

## 2017-06-11 ENCOUNTER — Telehealth: Payer: Self-pay

## 2017-06-11 DIAGNOSIS — M9902 Segmental and somatic dysfunction of thoracic region: Secondary | ICD-10-CM | POA: Diagnosis not present

## 2017-06-11 DIAGNOSIS — M5441 Lumbago with sciatica, right side: Secondary | ICD-10-CM | POA: Diagnosis not present

## 2017-06-11 DIAGNOSIS — M9905 Segmental and somatic dysfunction of pelvic region: Secondary | ICD-10-CM | POA: Diagnosis not present

## 2017-06-11 DIAGNOSIS — M9903 Segmental and somatic dysfunction of lumbar region: Secondary | ICD-10-CM | POA: Diagnosis not present

## 2017-06-11 NOTE — Telephone Encounter (Signed)
  Follow up Call-  Call back number 06/10/2017  Post procedure Call Back phone  # [phone]  Permission to leave phone message Yes  Some recent data might be hidden     Patient questions:  Do you have a fever, pain , or abdominal swelling? No. Pain Score  0 *  Have you tolerated food without any problems? Yes.    Have you been able to return to your normal activities? Yes.    Do you have any questions about your discharge instructions: Diet   No. Medications  No. Follow up visit  No.  Do you have questions or concerns about your Care? No.  Actions: * If pain score is 4 or above: No action needed, pain <4.

## 2017-06-14 ENCOUNTER — Encounter: Payer: Self-pay | Admitting: Internal Medicine

## 2017-06-27 DIAGNOSIS — M9905 Segmental and somatic dysfunction of pelvic region: Secondary | ICD-10-CM | POA: Diagnosis not present

## 2017-06-27 DIAGNOSIS — M9902 Segmental and somatic dysfunction of thoracic region: Secondary | ICD-10-CM | POA: Diagnosis not present

## 2017-06-27 DIAGNOSIS — M9903 Segmental and somatic dysfunction of lumbar region: Secondary | ICD-10-CM | POA: Diagnosis not present

## 2017-06-27 DIAGNOSIS — M5441 Lumbago with sciatica, right side: Secondary | ICD-10-CM | POA: Diagnosis not present

## 2017-08-07 ENCOUNTER — Ambulatory Visit: Payer: BLUE CROSS/BLUE SHIELD | Admitting: Family Medicine

## 2017-08-07 ENCOUNTER — Encounter: Payer: Self-pay | Admitting: Family Medicine

## 2017-08-07 VITALS — BP 110/78 | HR 76 | Temp 98.1°F | Resp 20 | Ht 63.0 in | Wt 156.0 lb

## 2017-08-07 DIAGNOSIS — J0100 Acute maxillary sinusitis, unspecified: Secondary | ICD-10-CM

## 2017-08-07 MED ORDER — BENZONATATE 200 MG PO CAPS: 200.0000 mg | ORAL_CAPSULE | Freq: Two times a day (BID) | ORAL | 0 refills | Status: DC | PRN

## 2017-08-07 MED ORDER — AMOXICILLIN-POT CLAVULANATE 875-125 MG PO TABS: 1.0000 | ORAL_TABLET | Freq: Two times a day (BID) | ORAL | 0 refills | Status: DC

## 2017-08-07 NOTE — Patient Instructions (Signed)
Rest, hydrate.  +/- flonase, mucinex (DM if cough), nettie pot or nasal saline.  Adding antihistamine can be helpful.  Galbreath maneuver a few times a day. Tessalon perles prescribed, take until completed.  If not better in 2 days or rebound in the next 1week, then start the antibiotic I printed and take until completed.  If cough present it can last up to 6-8 weeks.  F/U 2 weeks of not improved.    Sinusitis, Adult Sinusitis is soreness and inflammation of your sinuses. Sinuses are hollow spaces in the bones around your face. They are located:  Around your eyes.  In the middle of your forehead.  Behind your nose.  In your cheekbones.  Your sinuses and nasal passages are lined with a stringy fluid (mucus). Mucus normally drains out of your sinuses. When your nasal tissues get inflamed or swollen, the mucus can get trapped or blocked so air cannot flow through your sinuses. This lets bacteria, viruses, and funguses grow, and that leads to infection. Follow these instructions at home: Medicines  Take, use, or apply over-the-counter and prescription medicines only as told by your doctor. These may include nasal sprays.  If you were prescribed an antibiotic medicine, take it as told by your doctor. Do not stop taking the antibiotic even if you start to feel better. Hydrate and Humidify  Drink enough water to keep your pee (urine) clear or pale yellow.  Use a cool mist humidifier to keep the humidity level in your home above 50%.  Breathe in steam for 10-15 minutes, 3-4 times a day or as told by your doctor. You can do this in the bathroom while a hot shower is running.  Try not to spend time in cool or dry air. Rest  Rest as much as possible.  Sleep with your head raised (elevated).  Make sure to get enough sleep each night. General instructions  Put a warm, moist washcloth on your face 3-4 times a day or as told by your doctor. This will help with discomfort.  Wash your  hands often with soap and water. If there is no soap and water, use hand sanitizer.  Do not smoke. Avoid being around people who are smoking (secondhand smoke).  Keep all follow-up visits as told by your doctor. This is important. Contact a doctor if:  You have a fever.  Your symptoms get worse.  Your symptoms do not get better within 10 days. Get help right away if:  You have a very bad headache.  You cannot stop throwing up (vomiting).  You have pain or swelling around your face or eyes.  You have trouble seeing.  You feel confused.  Your neck is stiff.  You have trouble breathing. This information is not intended to replace advice given to you by your health care provider. Make sure you discuss any questions you have with your health care provider. Document Released: 10/17/2007 Document Revised: 12/25/2015 Document Reviewed: 02/23/2015 Elsevier Interactive Patient Education  [employer].

## 2017-08-07 NOTE — Progress Notes (Signed)
Sarah Velazquez, Sarah Velazquez 1960/08/16, 57 y.o., female MRN: [id number] Patient Care Team    Relationship Specialty Notifications Start End  Ma Hillock, DO PCP - General Family Medicine  08/03/15   Sable Feil, MD Consulting Physician Gastroenterology  08/03/15     Chief Complaint  Patient presents with  . URI    cough,congestion, ears draining x 1 week     Subjective: Pt presents for an OV with complaints of cough of x 1 week duration.  Associated symptoms include dry cough. Nasal congestion, headache, fever, ear fullness, swelling of her face. [place] airport [place] and feels she had exposure to a few sick people. She endorses feeling mildly improved over last two days, fever stopped then. Her cough is persistent.   Pt has tried ziacam, cold ease and advil to ease their symptoms.   Depression screen PHQ 2/9 08/20/2016  Decreased Interest 0  Down, Depressed, Hopeless 0  PHQ - 2 Score 0    Allergies  Allergen Reactions  . Sulfa Antibiotics    Social History   Tobacco Use  . Smoking status: Never Smoker  . Smokeless tobacco: Never Used  Substance Use Topics  . Alcohol use: Yes    Alcohol/week: 1.8 oz    Types: 3 Glasses of wine per week    Comment: occasionally   Past Medical History:  Diagnosis Date  . Allergy    seasonal, dogs, cats, horses, hay  . Arthritis    OA knees   . Asthma   . Bell's palsy in high school   right  . Cervical cancer screening 04/08/2012  . Chicken pox as a child  . Heart murmur    as child  . History of neutropenia    age [age], received gamma globulin monthly; with multiple hosp admisson for illness (per pt)  . Hyperglycemia   . Hyperlipidemia   . Hyperplastic colon polyp 04/09/2012  . Lipoma    growing slowly since 20s  . Menopause 2007  . Migraines   . Mumps as a child  . Shingles    right cheek  . Thrombocytosis ([place]) 04/09/2012  . Vitamin D deficiency    Past Surgical History:  Procedure Laterality Date  . CESAREAN  SECTION  05-10-87  . COLONOSCOPY    . cyst removed     twice on back and 1 on foot  . DILATION AND CURETTAGE OF UTERUS  1989   incomplete miscarriage with second pregnancy and again post menopausal  . INJECTION KNEE Bilateral 09/29/15   Gel  . POLYPECTOMY    . TONSILLECTOMY  as a child   Family History  Problem Relation Age of Onset  . Stroke Mother        X 3  . Heart attack Mother   . Diabetes Mother        type 2  . Anxiety disorder Mother   . Hypertension Mother   . Hyperlipidemia Mother   . Heart disease Mother   . Cancer Mother        breast, skin  . Colon polyps Mother   . Breast cancer Mother   . Stroke Father        several  . Heart attack Father        several  . Kidney disease Father        on dialysis  . Hypertension Father   . Hyperlipidemia Father   . Cancer Father  mouth X 2/ chewed tobacco, and skin  . Anxiety disorder Father   . Colon polyps Father   . Squamous cell carcinoma Father   . Hypertension Brother   . Hyperlipidemia Brother   . Depression Brother   . Cancer Maternal Grandmother        breast  . Diabetes Maternal Grandmother        type 2  . Obesity Maternal Grandmother   . Breast cancer Maternal Grandmother   . Cancer Paternal Grandmother        lung/ didn't smoke  . Heart attack Paternal Grandfather   . Lung cancer Paternal Grandfather   . Colon cancer Neg Hx   . Rectal cancer Neg Hx   . Stomach cancer Neg Hx    Allergies as of 08/07/2017      Reactions   Sulfa Antibiotics       Medication List        Accurate as of 08/07/17  8:30 AM. Always use your most recent med list.          Vitamin B-12 500 MCG Subl Place under the tongue. Spray daily       All past medical history, surgical history, allergies, family history, immunizations andmedications were updated in the EMR today and reviewed under the history and medication portions of their EMR.     ROS: Negative, with the exception of above mentioned in  HPI   Objective:  BP 110/78 (BP Location: Right Arm, Patient Position: Sitting, Cuff Size: Normal)   Pulse 76   Temp 98.1 F (36.7 C)   Resp 20   Ht 5\' 3"  (1.6 m)   Wt 156 lb (70.8 kg)   SpO2 97%   BMI 27.63 kg/m  Body mass index is 27.63 kg/m. Gen: Afebrile. No acute distress. Nontoxic in appearance, well developed, well nourished.  HENT: AT. . Bilateral TM visualized right  Ear fullness. MMM, no oral lesions. Bilateral nares with erythema and drainage. Throat without erythema or exudates. Cough and hoarseness present. TTp max sinus right Eyes:Pupils Equal Round Reactive to light, Extraocular movements intact,  Conjunctiva without redness, discharge or icterus. Neck/lymp/endocrine: Supple,no lymphadenopathy CV: RRR Chest: CTAB, no wheeze or crackles. Good air movement, normal resp effort.  Abd: Soft. NTND. BS present. no Masses palpated. No rebound or guarding. Skin: no rash presently, purpura or petechiae.  Neuro:  Normal gait. PERLA. EOMi. Alert. Oriented x3  No exam data present No results found. No results found for this or any previous visit (from the past 24 hour(s)).  Assessment/Plan: Sarah Velazquez is a 57 y.o. female present for OV for  1. Acute non-recurrent maxillary sinusitis Rest, hydrate. Possibly viral since she is seeing improvement in most sx over last 2 days +/- flonase, mucinex (DM if cough), nettie pot or nasal saline.  Galbreath maneuver instructed. Perform a few times a day.   Tessalon perles for cough . Augmentin prescribed to use only if sx not resolved or rebound in next few days indicating bacterial infection.  If cough present it can last up to 6-8 weeks.  F/U 2 weeks of not improved.     Reviewed expectations re: course of current medical issues.  Discussed self-management of symptoms.  Outlined signs and symptoms indicating need for more acute intervention.  Patient verbalized understanding and all questions were answered.  Patient received  an After-Visit Summary.    No orders of the defined types were placed in this encounter.    Note is dictated  utilizing voice recognition software. Although note has been proof read prior to signing, occasional typographical errors still can be missed. If any questions arise, please do not hesitate to call for verification.   electronically signed by:  Howard Pouch, DO  [place]

## 2017-08-21 ENCOUNTER — Encounter: Payer: Self-pay | Admitting: Family Medicine

## 2017-08-21 ENCOUNTER — Ambulatory Visit (INDEPENDENT_AMBULATORY_CARE_PROVIDER_SITE_OTHER): Payer: BLUE CROSS/BLUE SHIELD | Admitting: Family Medicine

## 2017-08-21 VITALS — BP 123/81 | HR 70 | Temp 98.3°F | Resp 20 | Ht 63.0 in | Wt 156.0 lb

## 2017-08-21 DIAGNOSIS — Z130 Encounter for screening for diseases of the blood and blood-forming organs and certain disorders involving the immune mechanism: Secondary | ICD-10-CM

## 2017-08-21 DIAGNOSIS — E785 Hyperlipidemia, unspecified: Secondary | ICD-10-CM | POA: Diagnosis not present

## 2017-08-21 DIAGNOSIS — R928 Other abnormal and inconclusive findings on diagnostic imaging of breast: Secondary | ICD-10-CM | POA: Diagnosis not present

## 2017-08-21 DIAGNOSIS — E559 Vitamin D deficiency, unspecified: Secondary | ICD-10-CM

## 2017-08-21 DIAGNOSIS — Z131 Encounter for screening for diabetes mellitus: Secondary | ICD-10-CM | POA: Diagnosis not present

## 2017-08-21 DIAGNOSIS — Z0000 Encounter for general adult medical examination without abnormal findings: Secondary | ICD-10-CM | POA: Diagnosis not present

## 2017-08-21 DIAGNOSIS — Z789 Other specified health status: Secondary | ICD-10-CM | POA: Diagnosis not present

## 2017-08-21 LAB — CBC WITH DIFFERENTIAL/PLATELET
BASOS PCT: 1.5 % (ref 0.0–3.0)
Basophils Absolute: 0.1 10*3/uL (ref 0.0–0.1)
Eosinophils Absolute: 0 10*3/uL (ref 0.0–0.7)
Eosinophils Relative: 0.8 % (ref 0.0–5.0)
HEMATOCRIT: 39.7 % (ref 36.0–46.0)
Hemoglobin: 13.6 g/dL (ref 12.0–15.0)
LYMPHS PCT: 47.6 % — AB (ref 12.0–46.0)
Lymphs Abs: 2.6 10*3/uL (ref 0.7–4.0)
MCHC: 34.4 g/dL (ref 30.0–36.0)
MCV: 89.1 fl (ref 78.0–100.0)
MONOS PCT: 8.4 % (ref 3.0–12.0)
Monocytes Absolute: 0.4 10*3/uL (ref 0.1–1.0)
NEUTROS ABS: 2.2 10*3/uL (ref 1.4–7.7)
Neutrophils Relative %: 41.7 % — ABNORMAL LOW (ref 43.0–77.0)
PLATELETS: 393 10*3/uL (ref 150.0–400.0)
RBC: 4.45 Mil/uL (ref 3.87–5.11)
RDW: 13.2 % (ref 11.5–15.5)
WBC: 5.4 10*3/uL (ref 4.0–10.5)

## 2017-08-21 LAB — COMPREHENSIVE METABOLIC PANEL
ALT: 19 U/L (ref 0–35)
AST: 18 U/L (ref 0–37)
Albumin: 4.5 g/dL (ref 3.5–5.2)
Alkaline Phosphatase: 59 U/L (ref 39–117)
BILIRUBIN TOTAL: 0.4 mg/dL (ref 0.2–1.2)
BUN: 7 mg/dL (ref 6–23)
CALCIUM: 9.8 mg/dL (ref 8.4–10.5)
CHLORIDE: 104 meq/L (ref 96–112)
CO2: 26 meq/L (ref 19–32)
Creatinine, Ser: 0.77 mg/dL (ref 0.40–1.20)
GFR: 82.21 mL/min (ref >60.00)
Glucose, Bld: 82 mg/dL (ref 70–99)
Potassium: 4.7 mEq/L (ref 3.5–5.1)
Sodium: 139 mEq/L (ref 135–145)
Total Protein: 6.9 g/dL (ref 6.0–8.3)

## 2017-08-21 LAB — HEMOGLOBIN A1C: Hgb A1c MFr Bld: 5.8 % (ref 4.6–6.5)

## 2017-08-21 LAB — LIPID PANEL
CHOL/HDL RATIO: 6
Cholesterol: 240 mg/dL — ABNORMAL HIGH (ref 0–200)
HDL: 39.6 mg/dL (ref >39.00)
NONHDL: 200.68
TRIGLYCERIDES: 283 mg/dL — AB (ref 0.0–149.0)
VLDL: 56.6 mg/dL — ABNORMAL HIGH (ref 0.0–40.0)

## 2017-08-21 LAB — LDL CHOLESTEROL, DIRECT: Direct LDL: 152 mg/dL

## 2017-08-21 LAB — TSH: TSH: 1.73 u[IU]/mL (ref 0.35–4.50)

## 2017-08-21 LAB — VITAMIN B12: Vitamin B-12: 573 pg/mL (ref 211–911)

## 2017-08-21 LAB — VITAMIN D 25 HYDROXY (VIT D DEFICIENCY, FRACTURES): VITD: 33.53 ng/mL (ref 30.00–100.00)

## 2017-08-21 NOTE — Progress Notes (Signed)
Patient ID: Sarah Velazquez, female  DOB: 01/30/1961, 57 y.o.   MRN: [id number] Patient Care Team    Relationship Specialty Notifications Start End  Sarah Hillock, DO PCP - General Family Medicine  08/03/15   Sarah Feil, MD Consulting Physician Gastroenterology  08/03/15     Chief Complaint  Patient presents with  . Annual Exam    Subjective:  Sarah Velazquez is a 57 y.o.  Female  present for CPE. All past medical history, surgical history, allergies, family history, immunizations, medications and social history were updated in the electronic medical record today. All recent labs, ED visits and hospitalizations within the last year were reviewed. Pt has made many dietary changes. She has been vegan for 18 months. She stopped ALL supplements 6 months, including her vit d and fish oil. She is still taking b12. She is concerned her B12 is low because she is vegan.   Health maintenance: Updated 08/21/2017 Colonoscopy: 06/10/2017, no FHX of colon cancer, mother with colon polyps. F/U 5 years unclear. Encouraged pt to call next year (5 year mark and discuss with GI).  Mammogram: 12/2016 normal, no FHX.  Cervical cancer screening: Last PAP 09/27/2015, normal, neg co-testing Immunizations: Td UTD 2013, declined influenza Infectious disease screening: HIV and hep completed DEXA: 2012, normal, rpt 10 years   Assistive device: none Oxygen BWL:SLHT Patient has a Dental home. Hospitalizations/ED visits: reviewed  Depression screen [facility] 2/9 08/21/2017 08/20/2016  Decreased Interest 0 0  Down, Depressed, Hopeless 0 0  PHQ - 2 Score 0 0   No flowsheet data found.   Current Exercise Habits: Structured exercise class, Type of exercise: yoga;walking, Time (Minutes): 60, Frequency (Times/Week): 7, Weekly Exercise (Minutes/Week): 420, Intensity: Moderate    Immunization History  Administered Date(s) Administered  . Influenza Split 02/12/2012  . Influenza Whole 02/12/2011  . Tdap 09/13/2011     Past Medical History:  Diagnosis Date  . Allergy    seasonal, dogs, cats, horses, hay  . Arthritis    OA knees   . Asthma   . Bell's palsy in high school   right  . Cervical cancer screening 04/08/2012  . Chicken pox as a child  . Heart murmur    as child  . History of neutropenia    age [age], received gamma globulin monthly; with multiple hosp admisson for illness (per pt)  . Hyperglycemia   . Hyperlipidemia   . Hyperplastic colon polyp 04/09/2012  . Lipoma    growing slowly since 20s  . Menopause 2007  . Migraines   . Mumps as a child  . Shingles    right cheek  . Thrombocytosis ([place]) 04/09/2012  . Vitamin D deficiency    Allergies  Allergen Reactions  . Sulfa Antibiotics    Past Surgical History:  Procedure Laterality Date  . CESAREAN SECTION  05-10-87  . COLONOSCOPY    . cyst removed     twice on back and 1 on foot  . DILATION AND CURETTAGE OF UTERUS  1989   incomplete miscarriage with second pregnancy and again post menopausal  . INJECTION KNEE Bilateral 09/29/15   Gel  . POLYPECTOMY    . TONSILLECTOMY  as a child   Family History  Problem Relation Age of Onset  . Stroke Mother        X 3  . Heart attack Mother   . Diabetes Mother        type 2  . Anxiety disorder Mother   .  Hypertension Mother   . Hyperlipidemia Mother   . Heart disease Mother   . Cancer Mother        breast, skin  . Colon polyps Mother   . Breast cancer Mother   . Stroke Father        several  . Heart attack Father        several  . Kidney disease Father        on dialysis  . Hypertension Father   . Hyperlipidemia Father   . Cancer Father        mouth X 2/ chewed tobacco, and skin  . Anxiety disorder Father   . Colon polyps Father   . Squamous cell carcinoma Father   . Hypertension Brother   . Hyperlipidemia Brother   . Depression Brother   . Cancer Maternal Grandmother        breast  . Diabetes Maternal Grandmother        type 2  . Obesity Maternal  Grandmother   . Breast cancer Maternal Grandmother   . Cancer Paternal Grandmother        lung/ didn't smoke  . Heart attack Paternal Grandfather   . Lung cancer Paternal Grandfather   . Colon cancer Neg Hx   . Rectal cancer Neg Hx   . Stomach cancer Neg Hx    Social History   Socioeconomic History  . Marital status: Married    Spouse name: Not on file  . Number of children: Not on file  . Years of education: Not on file  . Highest education level: Not on file  Occupational History  . Not on file  Social Needs  . Financial resource strain: Not on file  . Food insecurity:    Worry: Not on file    Inability: Not on file  . Transportation needs:    Medical: Not on file    Non-medical: Not on file  Tobacco Use  . Smoking status: Never Smoker  . Smokeless tobacco: Never Used  Substance and Sexual Activity  . Alcohol use: Yes    Alcohol/week: 1.8 oz    Types: 3 Glasses of wine per week    Comment: occasionally  . Drug use: No  . Sexual activity: Yes  Lifestyle  . Physical activity:    Days per week: Not on file    Minutes per session: Not on file  . Stress: Not on file  Relationships  . Social connections:    Talks on phone: Not on file    Gets together: Not on file    Attends religious service: Not on file    Active member of club or organization: Not on file    Attends meetings of clubs or organizations: Not on file    Relationship status: Not on file  . Intimate partner violence:    Fear of current or ex partner: Not on file    Emotionally abused: Not on file    Physically abused: Not on file    Forced sexual activity: Not on file  Other Topics Concern  . Not on file  Social History Narrative   Married to Sarah Velazquez. Has 3 children Sarah Velazquez, Sarah Velazquez, Sarah Velazquez)   Bachelors of science, homemaker.   Drinks caffeinated beverages, uses herbal remedies, takes a daily vitamin.   Wears her seatbelt, bicycle helmet and exercise is greater than 3 times a week.   Patient eats  vegetarian meals 5 days a week, minimal meat product.   Smoke detector in  the home, firearms in the home.   Feels safe in her relationships.   Allergies as of 08/21/2017      Reactions   Sulfa Antibiotics       Medication List        Accurate as of 08/21/17  8:43 AM. Always use your most recent med list.          Vitamin B-12 500 MCG Subl Place under the tongue. Spray daily       All past medical history, surgical history, allergies, family history, immunizations andmedications were updated in the EMR today and reviewed under the history and medication portions of their EMR.     No results found for this or any previous visit (from the past 2160 hour(s)).  US Breast Ltd Uni Left Inc Axilla  Result Date: 01/08/2017 CLINICAL DATA:  One year follow-up for asymmetry in the left breast. Six-month follow-up for probably benign left breast cysts. EXAM: 2D DIGITAL DIAGNOSTIC BILATERAL MAMMOGRAM WITH CAD AND ADJUNCT TOMO ULTRASOUND LEFT BREAST COMPARISON:  Prior studies including 05/08/2016 ACR Breast Density Category b: There are scattered areas of fibroglandular density. FINDINGS: No suspicious mass, distortion, or microcalcifications are identified to suggest presence of malignancy. No persistent asymmetry identified in medial portion of the left breast on spot compression views. Mammographic images were processed with CAD. Targeted ultrasound is performed, showing normal appearance of the 9 o'clock location of the left breast. There has been interval resolution of the small cysts noted previously. IMPRESSION: 1. No persistent asymmetric density in the medial aspect of the left breast. 2. Interval resolution of small cysts in the 9 o'clock location of the left breast. 3.  No mammographic or ultrasound evidence for malignancy. RECOMMENDATION: Screening mammogram in one year.(Code:SM-B-01Y) I have discussed the findings and recommendations with the patient. Results were also provided in writing at  the conclusion of the visit. If applicable, a reminder letter will be sent to the patient regarding the next appointment. BI-RADS CATEGORY  1: Negative. Electronically Signed   By: Nolon Nations M.D.   On: 01/03/2017 15:27   Mm Diag Breast Tomo Bilateral  Result Date: 01/03/2017 CLINICAL DATA:  One year follow-up for asymmetry in the left breast. Six-month follow-up for probably benign left breast cysts. EXAM: 2D DIGITAL DIAGNOSTIC BILATERAL MAMMOGRAM WITH CAD AND ADJUNCT TOMO ULTRASOUND LEFT BREAST COMPARISON:  Prior studies including 05/08/2016 ACR Breast Density Category b: There are scattered areas of fibroglandular density. FINDINGS: No suspicious mass, distortion, or microcalcifications are identified to suggest presence of malignancy. No persistent asymmetry identified in medial portion of the left breast on spot compression views. Mammographic images were processed with CAD. Targeted ultrasound is performed, showing normal appearance of the 9 o'clock location of the left breast. There has been interval resolution of the small cysts noted previously. IMPRESSION: 1. No persistent asymmetric density in the medial aspect of the left breast. 2. Interval resolution of small cysts in the 9 o'clock location of the left breast. 3.  No mammographic or ultrasound evidence for malignancy. RECOMMENDATION: Screening mammogram in one year.(Code:SM-B-01Y) I have discussed the findings and recommendations with the patient. Results were also provided in writing at the conclusion of the visit. If applicable, a reminder letter will be sent to the patient regarding the next appointment. BI-RADS CATEGORY  1: Negative. Electronically Signed   By: Nolon Nations M.D.   On: 01/03/2017 15:27     ROS: 14 pt review of systems performed and negative (unless mentioned in an HPI)  Objective: BP 123/81 (BP Location: Right Arm, Patient Position: Sitting, Cuff Size: Normal)   Pulse 70   Temp 98.3 F (36.8 C)   Resp 20    Ht _0  (1.6 m)   Wt 156 lb (70.8 kg)   SpO2 99%   BMI 27.63 kg/m  Gen: Afebrile. No acute distress. Nontoxic in appearance, well-developed, well-nourished,  pleasant caucasian female.  HENT: AT. [place]. Bilateral TM visualized and normal in appearance, normal external auditory canal. MMM, no oral lesions, adequate dentition. Bilateral nares within normal limits. Throat without erythema, ulcerations or exudates. no Cough on exam, no hoarseness on exam. Eyes:Pupils Equal Round Reactive to light, Extraocular movements intact,  Conjunctiva without redness, discharge or icterus. Neck/lymp/endocrine: Supple,no lymphadenopathy, no thyromegaly CV: RRR no murmur, no edema, +2/4 P posterior tibialis pulses. no carotid bruits. No JVD. Chest: CTAB, no wheeze, rhonchi or crackles. Normal Respiratory effort. good Air movement. Abd: Soft. NTND. BS present. no Masses palpated. No hepatosplenomegaly. No rebound tenderness or guarding. Skin: no rashes, purpura or petechiae. Warm and well-perfused. Skin intact. Neuro/Msk:  Normal gait. PERLA. EOMi. Alert. Oriented x3.  Cranial nerves II through XII intact. Muscle strength 5/5 upper/lower extremity. DTRs equal bilaterally. Psych: Normal affect, dress and demeanor. Normal speech. Normal thought content and judgment.  No exam data present  Assessment/plan: Sarah Velazquez is a 57 y.o. female present for CPE. Encounter for preventive health examination Patient was encouraged to exercise greater than 150 minutes a week. Patient was encouraged to choose a diet filled with fresh fruits and vegetables, and lean meats. AVS provided to patient today for education/recommendation on gender specific health and safety maintenance. Colonoscopy: 06/10/2017, polyp x1, no FHX of colon cancer, mother with colon polyps. 5 year f/u Mammogram: 12/2016 normal, no FHX.  Cervical cancer screening: Last PAP 09/27/2015, normal, neg co-testing Immunizations: Td UTD 2013, declined  influenza Infectious disease screening: HIV and hep completed DEXA: 2012, normal, rpt 10 years   Vitamin D deficiency - Vitamin D (25 hydroxy) Hyperlipidemia, unspecified hyperlipidemia type - Comp Met (CMET) - Lipid panel - TSH Screening for diabetes mellitus - HgB A1c Abnormal mammogram Repeat 12/2016 --> normal.  Vegan diet - B12--> pt aware this may not be covered by insurance Screening for deficiency anemia CBC .  Return in about 1 year (around 08/22/2018) for CPE.  Electronically signed by: Howard Pouch, DO [place]

## 2017-08-21 NOTE — Patient Instructions (Addendum)
Health Maintenance, Female Adopting a healthy lifestyle and getting preventive care can go a long way to promote health and wellness. Talk with your health care provider about what schedule of regular examinations is right for you. This is a good chance for you to check in with your provider about disease prevention and staying healthy. In between checkups, there are plenty of things you can do on your own. Experts have done a lot of research about which lifestyle changes and preventive measures are most likely to keep you healthy. Ask your health care provider for more information. Weight and diet Eat a healthy diet  Be sure to include plenty of vegetables, fruits, low-fat dairy products, and lean protein.  Do not eat a lot of foods high in solid fats, added sugars, or salt.  Get regular exercise. This is one of the most important things you can do for your health. ? Most adults should exercise for at least 150 minutes each week. The exercise should increase your heart rate and make you sweat (moderate-intensity exercise). ? Most adults should also do strengthening exercises at least twice a week. This is in addition to the moderate-intensity exercise.  Maintain a healthy weight  Body mass index (BMI) is a measurement that can be used to identify possible weight problems. It estimates body fat based on height and weight. Your health care provider can help determine your BMI and help you achieve or maintain a healthy weight.  For females 20 years of age and older: ? A BMI below 18.5 is considered underweight. ? A BMI of 18.5 to 24.9 is normal. ? A BMI of 25 to 29.9 is considered overweight. ? A BMI of 30 and above is considered obese.  Watch levels of cholesterol and blood lipids  You should start having your blood tested for lipids and cholesterol at 57 years of age, then have this test every 5 years.  You may need to have your cholesterol levels checked more often if: ? Your lipid or  cholesterol levels are high. ? You are older than 57 years of age. ? You are at high risk for heart disease.  Cancer screening Lung Cancer  Lung cancer screening is recommended for adults 55-80 years old who are at high risk for lung cancer because of a history of smoking.  A yearly low-dose CT scan of the lungs is recommended for people who: ? Currently smoke. ? Have quit within the past 15 years. ? Have at least a 30-pack-year history of smoking. A pack year is smoking an average of one pack of cigarettes a day for 1 year.  Yearly screening should continue until it has been 15 years since you quit.  Yearly screening should stop if you develop a health problem that would prevent you from having lung cancer treatment.  Breast Cancer  Practice breast self-awareness. This means understanding how your breasts normally appear and feel.  It also means doing regular breast self-exams. Let your health care provider know about any changes, no matter how small.  If you are in your 20s or 30s, you should have a clinical breast exam (CBE) by a health care provider every 1-3 years as part of a regular health exam.  If you are 40 or older, have a CBE every year. Also consider having a breast X-ray (mammogram) every year.  If you have a family history of breast cancer, talk to your health care provider about genetic screening.  If you are at high risk   for breast cancer, talk to your health care provider about having an MRI and a mammogram every year.  Breast cancer gene (BRCA) assessment is recommended for women who have family members with BRCA-related cancers. BRCA-related cancers include: ? Breast. ? Ovarian. ? Tubal. ? Peritoneal cancers.  Results of the assessment will determine the need for genetic counseling and BRCA1 and BRCA2 testing.  Cervical Cancer Your health care provider may recommend that you be screened regularly for cancer of the pelvic organs (ovaries, uterus, and  vagina). This screening involves a pelvic examination, including checking for microscopic changes to the surface of your cervix (Pap test). You may be encouraged to have this screening done every 3 years, beginning at age 22.  For women ages 56-65, health care providers may recommend pelvic exams and Pap testing every 3 years, or they may recommend the Pap and pelvic exam, combined with testing for human papilloma virus (HPV), every 5 years. Some types of HPV increase your risk of cervical cancer. Testing for HPV may also be done on women of any age with unclear Pap test results.  Other health care providers may not recommend any screening for nonpregnant women who are considered low risk for pelvic cancer and who do not have symptoms. Ask your health care provider if a screening pelvic exam is right for you.  If you have had past treatment for cervical cancer or a condition that could lead to cancer, you need Pap tests and screening for cancer for at least 20 years after your treatment. If Pap tests have been discontinued, your risk factors (such as having a new sexual partner) need to be reassessed to determine if screening should resume. Some women have medical problems that increase the chance of getting cervical cancer. In these cases, your health care provider may recommend more frequent screening and Pap tests.  Colorectal Cancer  This type of cancer can be detected and often prevented.  Routine colorectal cancer screening usually begins at 57 years of age and continues through 57 years of age.  Your health care provider may recommend screening at an earlier age if you have risk factors for colon cancer.  Your health care provider may also recommend using home test kits to check for hidden blood in the stool.  A small camera at the end of a tube can be used to examine your colon directly (sigmoidoscopy or colonoscopy). This is done to check for the earliest forms of colorectal  cancer.  Routine screening usually begins at age 33.  Direct examination of the colon should be repeated every 5-10 years through 57 years of age. However, you may need to be screened more often if early forms of precancerous polyps or small growths are found.  Skin Cancer  Check your skin from head to toe regularly.  Tell your health care provider about any new moles or changes in moles, especially if there is a change in a mole's shape or color.  Also tell your health care provider if you have a mole that is larger than the size of a pencil eraser.  Always use sunscreen. Apply sunscreen liberally and repeatedly throughout the day.  Protect yourself by wearing long sleeves, pants, a wide-brimmed hat, and sunglasses whenever you are outside.  Heart disease, diabetes, and high blood pressure  High blood pressure causes heart disease and increases the risk of stroke. High blood pressure is more likely to develop in: ? People who have blood pressure in the high end of  the normal range (130-139/85-89 mm Hg). ? People who are overweight or obese. ? People who are African American.  If you are 21-29 years of age, have your blood pressure checked every 3-5 years. If you are 3 years of age or older, have your blood pressure checked every year. You should have your blood pressure measured twice-once when you are at a hospital or clinic, and once when you are not at a hospital or clinic. Record the average of the two measurements. To check your blood pressure when you are not at a hospital or clinic, you can use: ? An automated blood pressure machine at a pharmacy. ? A home blood pressure monitor.  If you are between 17 years and 37 years old, ask your health care provider if you should take aspirin to prevent strokes.  Have regular diabetes screenings. This involves taking a blood sample to check your fasting blood sugar level. ? If you are at a normal weight and have a low risk for diabetes,  have this test once every three years after 57 years of age. ? If you are overweight and have a high risk for diabetes, consider being tested at a younger age or more often. Preventing infection Hepatitis B  If you have a higher risk for hepatitis B, you should be screened for this virus. You are considered at high risk for hepatitis B if: ? You were born in a country where hepatitis B is common. Ask your health care provider which countries are considered high risk. ? Your parents were born in a high-risk country, and you have not been immunized against hepatitis B (hepatitis B vaccine). ? You have HIV or AIDS. ? You use needles to inject street drugs. ? You live with someone who has hepatitis B. ? You have had sex with someone who has hepatitis B. ? You get hemodialysis treatment. ? You take certain medicines for conditions, including cancer, organ transplantation, and autoimmune conditions.  Hepatitis C  Blood testing is recommended for: ? Everyone born from [birth year] through 1965. ? Anyone with known risk factors for hepatitis C.  Sexually transmitted infections (STIs)  You should be screened for sexually transmitted infections (STIs) including gonorrhea and chlamydia if: ? You are sexually active and are younger than 57 years of age. ? You are older than 57 years of age and your health care provider tells you that you are at risk for this type of infection. ? Your sexual activity has changed since you were last screened and you are at an increased risk for chlamydia or gonorrhea. Ask your health care provider if you are at risk.  If you do not have HIV, but are at risk, it may be recommended that you take a prescription medicine daily to prevent HIV infection. This is called pre-exposure prophylaxis (PrEP). You are considered at risk if: ? You are sexually active and do not regularly use condoms or know the HIV status of your partner(s). ? You take drugs by injection. ? You are  sexually active with a partner who has HIV.  Talk with your health care provider about whether you are at high risk of being infected with HIV. If you choose to begin PrEP, you should first be tested for HIV. You should then be tested every 3 months for as long as you are taking PrEP. Pregnancy  If you are premenopausal and you may become pregnant, ask your health care provider about preconception counseling.  If you may become  pregnant, take 400 to 800 micrograms (mcg) of folic acid every day.  If you want to prevent pregnancy, talk to your health care provider about birth control (contraception). Osteoporosis and menopause  Osteoporosis is a disease in which the bones lose minerals and strength with aging. This can result in serious bone fractures. Your risk for osteoporosis can be identified using a bone density scan.  If you are 65 years of age or older, or if you are at risk for osteoporosis and fractures, ask your health care provider if you should be screened.  Ask your health care provider whether you should take a calcium or vitamin D supplement to lower your risk for osteoporosis.  Menopause may have certain physical symptoms and risks.  Hormone replacement therapy may reduce some of these symptoms and risks. Talk to your health care provider about whether hormone replacement therapy is right for you. Follow these instructions at home:  Schedule regular health, dental, and eye exams.  Stay current with your immunizations.  Do not use any tobacco products including cigarettes, chewing tobacco, or electronic cigarettes.  If you are pregnant, do not drink alcohol.  If you are breastfeeding, limit how much and how often you drink alcohol.  Limit alcohol intake to no more than 1 drink per day for nonpregnant women. One drink equals 12 ounces of beer, 5 ounces of wine, or 1 ounces of hard liquor.  Do not use street drugs.  Do not share needles.  Ask your health care  provider for help if you need support or information about quitting drugs.  Tell your health care provider if you often feel depressed.  Tell your health care provider if you have ever been abused or do not feel safe at home. This information is not intended to replace advice given to you by your health care provider. Make sure you discuss any questions you have with your health care provider. Document Released: 11/13/2010 Document Revised: 10/06/2015 Document Reviewed: 02/01/2015 Elsevier Interactive Patient Education  2018 [employer]   Please help us help you:  We are honored you have chosen [facility] for your Primary Care home. Below you will find basic instructions that you may need to access in the future. Please help us help you by reading the instructions, which cover many of the frequent questions we experience.   Prescription refills and request:  -In order to allow more efficient response time, please call your pharmacy for all refills. They will forward the request electronically to us. This allows for the quickest possible response. Request left on a nurse line can take longer to refill, since these are checked as time allows between office patients and other phone calls.  - refill request can take up to 3-5 working days to complete.  - If request is sent electronically and request is appropiate, it is usually completed in 1-2 business days.  - all patients will need to be seen routinely for all chronic medical conditions requiring prescription medications (see follow-up below). If you are overdue for follow up on your condition, you will be asked to make an appointment and we will call in enough medication to cover you until your appointment (up to 30 days).  - all controlled substances will require a face to face visit to request/refill.  - if you desire your prescriptions to go through a new pharmacy, and have an active script at original pharmacy, you will need to call  your pharmacy and have scripts transferred to   new pharmacy. This is completed between the pharmacy locations and not by your provider.    Results: If any images or labs were ordered, it can take up to 1 week to get results depending on the test ordered and the lab/facility running and resulting the test. - Normal or stable results, which do not need further discussion, may be released to your mychart immediately with attached note to you. A call may not be generated for normal results. Please make certain to sign up for mychart. If you have questions on how to activate your mychart you can call the front office.  - If your results need further discussion, our office will attempt to contact you via phone, and if unable to reach you after 2 attempts, we will release your abnormal result to your mychart with instructions.  - All results will be automatically released in mychart after 1 week.  - Your provider will provide you with explanation and instruction on all relevant material in your results. Please keep in mind, results and labs may appear confusing or abnormal to the untrained eye, but it does not mean they are actually abnormal for you personally. If you have any questions about your results that are not covered, or you desire more detailed explanation than what was provided, you should make an appointment with your provider to do so.   Our office handles many outgoing and incoming calls daily. If we have not contacted you within 1 week about your results, please check your mychart to see if there is a message first and if not, then contact our office.  In helping with this matter, you help decrease call volume, and therefore allow us to be able to respond to patients needs more efficiently.   Acute office visits (sick visit):  An acute visit is intended for a new problem and are scheduled in shorter time slots to allow schedule openings for patients with new problems. This is the appropriate visit  to discuss a new problem. In order to provide you with excellent quality medical care with proper time for you to explain your problem, have an exam and receive treatment with instructions, these appointments should be limited to one new problem per visit. If you experience a new problem, in which you desire to be addressed, please make an acute office visit, we save openings on the schedule to accommodate you. Please do not save your new problem for any other type of visit, let us take care of it properly and quickly for you.   Follow up visits:  Depending on your condition(s) your provider will need to see you routinely in order to provide you with quality care and prescribe medication(s). Most chronic conditions (Example: hypertension, Diabetes, depression/anxiety... etc), require visits a couple times a year. Your provider will instruct you on proper follow up for your personal medical conditions and history. Please make certain to make follow up appointments for your condition as instructed. Failing to do so could result in lapse in your medication treatment/refills. If you request a refill, and are overdue to be seen on a condition, we will always provide you with a 30 day script (once) to allow you time to schedule.    Medicare wellness (well visit): - we have a wonderful Nurse (Kim), that will meet with you and provide you will yearly medicare wellness visits. These visits should occur yearly (can not be scheduled less than 1 calendar year apart) and cover preventive health, immunizations, advance directives and   you are entitled to yearly through your medicare benefits. Do not miss out on your entitled benefits, this is when medicare will pay for these benefits to be ordered for you.  These are strongly encouraged by your provider and is the appropriate type of visit to make certain you are up to date with all preventive health benefits. If you have not had your medicare wellness exam in  the last 12 months, please make certain to schedule one by calling the office and schedule your medicare wellness with Maudie Mercury as soon as possible.   Yearly physical (well visit):  - Adults are recommended to be seen yearly for physicals. Check with your insurance and date of your last physical, most insurances require one calendar year between physicals. Physicals include all preventive health topics, screenings, medical exam and labs that are appropriate for gender/age and history. You may have fasting labs needed at this visit. This is a well visit (not a sick visit), new problems should not be covered during this visit (see acute visit).  - Pediatric patients are seen more frequently when they are younger. Your provider will advise you on well child visit timing that is appropriate for your their age. - This is not a medicare wellness visit. Medicare wellness exams do not have an exam portion to the visit. Some medicare companies allow for a physical, some do not allow a yearly physical. If your medicare allows a yearly physical you can schedule the medicare wellness with our nurse Maudie Mercury and have your physical with your provider after, on the same day. Please check with insurance for your full benefits.   Late Policy/No Shows:  - all new patients should arrive 15-30 minutes earlier than appointment to allow [place] time  to  obtain all personal demographics,  insurance information and for you to complete office paperwork. - All established patients should arrive 10-15 minutes earlier than appointment time to update all information and be checked in .  - In our best efforts to run on time, if you are late for your appointment you will be asked to either reschedule or if able, we will work you back into the schedule. There will be a wait time to work you back in the schedule,  depending on availability.  - If you are unable to make it to your appointment as scheduled, please call 24 hours ahead of time to allow [place]  to fill the time slot with someone else who needs to be seen. If you do not cancel your appointment ahead of time, you may be charged a no show fee.

## 2017-08-22 ENCOUNTER — Telehealth: Payer: Self-pay | Admitting: Family Medicine

## 2017-08-22 NOTE — Telephone Encounter (Signed)
Spoke with patient reviewed lab results and instructions. Patient verbalized understanding. Patient wants to research information on fenofibrate she will let us know if she wants to start this medication.

## 2017-08-22 NOTE — Telephone Encounter (Signed)
Please inform patient the following information: Her labs are normal with the exception of her cholesterol. Her cholesterol is a little higher than 6 months ago. Her cholesterol level puts her at a higher risk for MI/stroke. She would benefit from the fiber based medicine we discussed last time called fenofibrate. She should also consider restarting the FISH OIL 3000 mg (NOT KRILL OIL) a1c is the same at 5.8. This is high normal range, this is ok as long as it does not continue to climb and progress to diabetes.  Vit D (low normal) and B12 normal.  Lipid Panel     Component Value Date/Time   CHOL 240 (H) 08/21/2017 0845   TRIG 283.0 (H) 08/21/2017 0845   HDL 39.60 08/21/2017 0845   CHOLHDL 6 08/21/2017 0845   VLDL 56.6 (H) 08/21/2017 0845   LDLDIRECT 152.0 08/21/2017 0845    If agreeable to start fenofibrate I will call it in for her.

## 2017-08-22 NOTE — Telephone Encounter (Signed)
Called patient left message for patient to return call 

## 2017-09-02 DIAGNOSIS — M9905 Segmental and somatic dysfunction of pelvic region: Secondary | ICD-10-CM | POA: Diagnosis not present

## 2017-09-02 DIAGNOSIS — M9903 Segmental and somatic dysfunction of lumbar region: Secondary | ICD-10-CM | POA: Diagnosis not present

## 2017-09-02 DIAGNOSIS — M9902 Segmental and somatic dysfunction of thoracic region: Secondary | ICD-10-CM | POA: Diagnosis not present

## 2017-09-02 DIAGNOSIS — M5441 Lumbago with sciatica, right side: Secondary | ICD-10-CM | POA: Diagnosis not present

## 2017-09-04 DIAGNOSIS — M9905 Segmental and somatic dysfunction of pelvic region: Secondary | ICD-10-CM | POA: Diagnosis not present

## 2017-09-04 DIAGNOSIS — M9902 Segmental and somatic dysfunction of thoracic region: Secondary | ICD-10-CM | POA: Diagnosis not present

## 2017-09-04 DIAGNOSIS — M5441 Lumbago with sciatica, right side: Secondary | ICD-10-CM | POA: Diagnosis not present

## 2017-09-04 DIAGNOSIS — M9903 Segmental and somatic dysfunction of lumbar region: Secondary | ICD-10-CM | POA: Diagnosis not present

## 2017-09-09 DIAGNOSIS — M9902 Segmental and somatic dysfunction of thoracic region: Secondary | ICD-10-CM | POA: Diagnosis not present

## 2017-09-09 DIAGNOSIS — M5441 Lumbago with sciatica, right side: Secondary | ICD-10-CM | POA: Diagnosis not present

## 2017-09-09 DIAGNOSIS — M9903 Segmental and somatic dysfunction of lumbar region: Secondary | ICD-10-CM | POA: Diagnosis not present

## 2017-09-09 DIAGNOSIS — M9905 Segmental and somatic dysfunction of pelvic region: Secondary | ICD-10-CM | POA: Diagnosis not present

## 2017-09-12 DIAGNOSIS — M9905 Segmental and somatic dysfunction of pelvic region: Secondary | ICD-10-CM | POA: Diagnosis not present

## 2017-09-12 DIAGNOSIS — M9902 Segmental and somatic dysfunction of thoracic region: Secondary | ICD-10-CM | POA: Diagnosis not present

## 2017-09-12 DIAGNOSIS — M5441 Lumbago with sciatica, right side: Secondary | ICD-10-CM | POA: Diagnosis not present

## 2017-09-12 DIAGNOSIS — M9903 Segmental and somatic dysfunction of lumbar region: Secondary | ICD-10-CM | POA: Diagnosis not present

## 2017-09-24 DIAGNOSIS — M9905 Segmental and somatic dysfunction of pelvic region: Secondary | ICD-10-CM | POA: Diagnosis not present

## 2017-09-24 DIAGNOSIS — M9902 Segmental and somatic dysfunction of thoracic region: Secondary | ICD-10-CM | POA: Diagnosis not present

## 2017-09-24 DIAGNOSIS — M5441 Lumbago with sciatica, right side: Secondary | ICD-10-CM | POA: Diagnosis not present

## 2017-09-24 DIAGNOSIS — M9903 Segmental and somatic dysfunction of lumbar region: Secondary | ICD-10-CM | POA: Diagnosis not present

## 2017-09-26 DIAGNOSIS — M9902 Segmental and somatic dysfunction of thoracic region: Secondary | ICD-10-CM | POA: Diagnosis not present

## 2017-09-26 DIAGNOSIS — M9905 Segmental and somatic dysfunction of pelvic region: Secondary | ICD-10-CM | POA: Diagnosis not present

## 2017-09-26 DIAGNOSIS — M5441 Lumbago with sciatica, right side: Secondary | ICD-10-CM | POA: Diagnosis not present

## 2017-09-26 DIAGNOSIS — M9903 Segmental and somatic dysfunction of lumbar region: Secondary | ICD-10-CM | POA: Diagnosis not present

## 2017-10-01 DIAGNOSIS — M5441 Lumbago with sciatica, right side: Secondary | ICD-10-CM | POA: Diagnosis not present

## 2017-10-01 DIAGNOSIS — M9903 Segmental and somatic dysfunction of lumbar region: Secondary | ICD-10-CM | POA: Diagnosis not present

## 2017-10-01 DIAGNOSIS — M9905 Segmental and somatic dysfunction of pelvic region: Secondary | ICD-10-CM | POA: Diagnosis not present

## 2017-10-01 DIAGNOSIS — M9902 Segmental and somatic dysfunction of thoracic region: Secondary | ICD-10-CM | POA: Diagnosis not present

## 2017-10-21 DIAGNOSIS — I889 Nonspecific lymphadenitis, unspecified: Secondary | ICD-10-CM | POA: Diagnosis not present

## 2017-10-30 DIAGNOSIS — M9903 Segmental and somatic dysfunction of lumbar region: Secondary | ICD-10-CM | POA: Diagnosis not present

## 2017-10-30 DIAGNOSIS — M9905 Segmental and somatic dysfunction of pelvic region: Secondary | ICD-10-CM | POA: Diagnosis not present

## 2017-10-30 DIAGNOSIS — M9902 Segmental and somatic dysfunction of thoracic region: Secondary | ICD-10-CM | POA: Diagnosis not present

## 2017-10-30 DIAGNOSIS — M5441 Lumbago with sciatica, right side: Secondary | ICD-10-CM | POA: Diagnosis not present

## 2017-10-31 ENCOUNTER — Encounter: Payer: Self-pay | Admitting: Family Medicine

## 2017-10-31 ENCOUNTER — Ambulatory Visit: Payer: BLUE CROSS/BLUE SHIELD | Admitting: Family Medicine

## 2017-10-31 VITALS — BP 126/84 | HR 69 | Temp 98.3°F | Resp 20 | Ht 63.0 in | Wt 154.0 lb

## 2017-10-31 DIAGNOSIS — W57XXXA Bitten or stung by nonvenomous insect and other nonvenomous arthropods, initial encounter: Secondary | ICD-10-CM | POA: Diagnosis not present

## 2017-10-31 DIAGNOSIS — M791 Myalgia, unspecified site: Secondary | ICD-10-CM

## 2017-10-31 DIAGNOSIS — R21 Rash and other nonspecific skin eruption: Secondary | ICD-10-CM

## 2017-10-31 DIAGNOSIS — R590 Localized enlarged lymph nodes: Secondary | ICD-10-CM | POA: Diagnosis not present

## 2017-10-31 DIAGNOSIS — M2550 Pain in unspecified joint: Secondary | ICD-10-CM

## 2017-10-31 DIAGNOSIS — E785 Hyperlipidemia, unspecified: Secondary | ICD-10-CM

## 2017-10-31 MED ORDER — DOXYCYCLINE HYCLATE 100 MG PO TABS: 100.0000 mg | ORAL_TABLET | Freq: Two times a day (BID) | ORAL | 0 refills | Status: DC

## 2017-10-31 NOTE — Progress Notes (Signed)
Sarah Velazquez, Sarah Velazquez [birth year], 57 y.o., female MRN: [id number] Patient Care Team    Relationship Specialty Notifications Start End  Ma Hillock, DO PCP - General Family Medicine  08/03/15   Pyrtle, Lajuan Lines, MD Consulting Physician Gastroenterology  08/21/17     Chief Complaint  Patient presents with  . Follow-up    ER visit-swollen lymph gland groin,headaches,muscle pain     Subjective: Pt presents for an OV with complaints of recent ED visit for swollen lymph gland in her left groin area with development of headache and muscle pain after tick bite.  Patient reports she was on vacation when she experienced tick bites on her leg and labia.  She developed a very large swollen tender lymph node in her left groin area.  She went to a clinic which sent her to the emergency room while on vacation and they provided her with doxycycline coverage.  She also developed a rash on her left forearm which she is uncertain if it was poison ivy or from the tick exposure.  She had been treating it with hydrocortisone cream.  She feels the arm rash is now returning.  She is on the last day of her doxycycline.  She feels like there is pain in her legs.  Depression screen [facility] 2/9 10/31/2017 08/21/2017 08/20/2016  Decreased Interest 0 0 0  Down, Depressed, Hopeless 0 0 0  PHQ - 2 Score 0 0 0    Allergies  Allergen Reactions  . Sulfa Antibiotics    Social History   Tobacco Use  . Smoking status: Never Smoker  . Smokeless tobacco: Never Used  Substance Use Topics  . Alcohol use: Yes    Alcohol/week: 1.8 oz    Types: 3 Glasses of wine per week    Comment: occasionally   Past Medical History:  Diagnosis Date  . Allergy    seasonal, dogs, cats, horses, hay  . Arthritis    OA knees   . Asthma   . Bell's palsy in high school   right  . Cervical cancer screening 04/08/2012  . Chicken pox as a child  . Heart murmur    as child  . History of neutropenia    age [age], received gamma globulin monthly; with  multiple hosp admisson for illness (per pt)  . Hyperglycemia   . Hyperlipidemia   . Hyperplastic colon polyp 04/09/2012  . Lipoma    growing slowly since 20s  . Menopause 2007  . Migraines   . Mumps as a child  . Shingles    right cheek  . Thrombocytosis ([place]) 04/09/2012  . Vitamin D deficiency    Past Surgical History:  Procedure Laterality Date  . CESAREAN SECTION  05-10-87  . COLONOSCOPY    . cyst removed     twice on back and 1 on foot  . DILATION AND CURETTAGE OF UTERUS  1989   incomplete miscarriage with second pregnancy and again post menopausal  . INJECTION KNEE Bilateral 09/29/15   Gel  . POLYPECTOMY    . TONSILLECTOMY  as a child   Family History  Problem Relation Age of Onset  . Stroke Mother        X 3  . Heart attack Mother   . Diabetes Mother        type 2  . Anxiety disorder Mother   . Hypertension Mother   . Hyperlipidemia Mother   . Heart disease Mother   . Cancer Mother  breast, skin  . Colon polyps Mother   . Breast cancer Mother   . Stroke Father        several  . Heart attack Father        several  . Kidney disease Father        on dialysis  . Hypertension Father   . Hyperlipidemia Father   . Cancer Father        mouth X 2/ chewed tobacco, and skin  . Anxiety disorder Father   . Colon polyps Father   . Squamous cell carcinoma Father   . Hypertension Brother   . Hyperlipidemia Brother   . Depression Brother   . Cancer Maternal Grandmother        breast  . Diabetes Maternal Grandmother        type 2  . Obesity Maternal Grandmother   . Breast cancer Maternal Grandmother   . Cancer Paternal Grandmother        lung/ didn't smoke  . Heart attack Paternal Grandfather   . Lung cancer Paternal Grandfather   . Colon cancer Neg Hx   . Rectal cancer Neg Hx   . Stomach cancer Neg Hx    Allergies as of 10/31/2017      Reactions   Sulfa Antibiotics       Medication List        Accurate as of 10/31/17  9:46 AM. Always use your  most recent med list.          Vitamin B-12 500 MCG Subl Place under the tongue. Spray daily       All past medical history, surgical history, allergies, family history, immunizations andmedications were updated in the EMR today and reviewed under the history and medication portions of their EMR.     ROS: Negative, with the exception of above mentioned in HPI   Objective:  BP 126/84 (BP Location: Right Arm, Patient Position: Sitting, Cuff Size: Large)   Pulse 69   Temp 98.3 F (36.8 C)   Resp 20   Ht 5\' 3"  (1.6 m)   Wt 154 lb (69.9 kg)   SpO2 97%   BMI 27.28 kg/m  Body mass index is 27.28 kg/m. Gen: Afebrile. No acute distress. Nontoxic in appearance, well developed, well nourished.  HENT: AT. [place].  MMM, no oral lesions.Throat without erythema or exudates.  No cough.  No hoarseness. Eyes:Pupils Equal Round Reactive to light, Extraocular movements intact,  Conjunctiva without redness, discharge or icterus. Neck/lymp/endocrine: Supple, no lymphadenopathy CV: RRR no murmur, no edema Chest: CTAB, no wheeze or crackles. Good air movement, normal resp effort.  MSK: No erythema, no swelling, no deformities.  No joint swelling.  Full range of motion. Skin: Very mild fine rash left forearm , no purpura or petechiae.  Neuro: Normal gait. PERLA. EOMi. Alert. Oriented x3  Psych: Normal affect, dress and demeanor. Normal speech. Normal thought content and judgment.  No exam data present No results found. No results found for this or any previous visit (from the past 24 hour(s)).  Assessment/Plan: [name] is a 57 y.o. female present for OV for  Tick bite, initial encounter/ Polyarthralgia/myalgia/rash/lymphadenopathy Agree her constellation of symptoms are consistent with possible Lyme exposure.  Continue treatment of doxycycline for full 21 days total.  Does desire testing for Lyme disease and [facility] spotted fever.  She will return to the clinic in 4-6 weeks for testing, lab  appointment only, orders  placed. - doxycycline (VIBRA-TABS) 100 MG tablet;  Take 1 tablet (100 mg total) by mouth 2 (two) times daily.  Dispense: 42 tablet; Refill: 0 -Continue to use hydrocortisone cream on rash. - Sedimentation rate; Future - C-reactive protein; Future - B. burgdorfi antibodies; Future - [facility] spotted fvr abs pnl(IgG+IgM); Future -Follow-up not needed unless labs indicate need or symptoms are not improving.   Hyperlipidemia:  - Lipid panel; Future--> trg recheck   Reviewed expectations re: course of current medical issues.  Discussed self-management of symptoms.  Outlined signs and symptoms indicating need for more acute intervention.  Patient verbalized understanding and all questions were answered.  Patient received an After-Visit Summary.    No orders of the defined types were placed in this encounter.    Note is dictated utilizing voice recognition software. Although note has been proof read prior to signing, occasional typographical errors still can be missed. If any questions arise, please do not hesitate to call for verification.   electronically signed by:  Howard Pouch, DO  [place]

## 2017-10-31 NOTE — Patient Instructions (Signed)
Please make a lab appt in 2-4 weeks for testing (fasting).  Doxycycline every 12 hours until completed.   Lyme Disease Lyme disease is an infection that affects many parts of the body, including the skin, joints, and nervous system. It is a bacterial infection that starts from the bite of an infected tick. The infection can spread, and some of the symptoms are similar to the flu. If Lyme disease is not treated, it may cause joint pain, swelling, numbness, problems thinking, fatigue, muscle weakness, and other problems. What are the causes? This condition is caused by bacteria called Borrelia burgdorferi. You can get Lyme disease by being bitten by an infected tick. The tick must be attached to your skin to pass along the infection. Deer often carry infected ticks. What increases the risk? The following factors may make you more likely to develop this condition:  Living in or visiting these areas in the [place] ? [place]. ? [place]. ? [place].  Spending time in wooded or grassy areas.  Being outdoors with exposed skin.  Camping, gardening, hiking, fishing, or hunting outdoors.  Failing to remove a tick from your skin within 3-4 days.  What are the signs or symptoms? Symptoms of this condition include:  A round, red rash that surrounds the center of the tick bite. This is the first sign of infection. The center of the rash may be blood colored or have tiny blisters.  Fatigue.  Headache.  Chills and fever.  General achiness.  Joint pain, often in the knees.  Muscle pain.  Swollen lymph glands.  Stiff neck.  How is this diagnosed? This condition is diagnosed based on:  Your symptoms and medical history.  A physical exam.  A blood test.  How is this treated? The main treatment for this condition is antibiotic medicine, which is usually taken by mouth (orally). The length of treatment depends on how soon after a tick bite you begin taking  the medicine. In some cases, treatment is necessary for several weeks. If the infection is severe, antibiotics may need to be given through an IV tube that is inserted into one of your veins. Follow these instructions at home:  Take your antibiotic medicine as told by your health care provider. Do not stop taking the antibiotic even if you start to feel better.  Ask your health care provider about takinga probiotic in between doses of your antibiotic to help avoid stomach upset or diarrhea.  Check with your health care provider before supplementing your treatment. Many alternative therapies have not been proven and may be harmful to you.  Keep all follow-up visits as told by your health care provider. This is important. How is this prevented? You can become reinfected if you get another tick bite from an infected tick. Take these steps to help prevent an infection:  Cover your skin with light-colored clothing when you are outdoors in the spring and summer months.  Spray clothing and skin with bug spray. The spray should be 20-30% DEET.  Avoid wooded, grassy, and shaded areas.  Remove yard litter, brush, trash, and plants that attract deer and rodents.  Check yourself for ticks when you come indoors.  Wash clothing worn each day.  Check your pets for ticks before they come inside.  If you find a tick: ? Remove it with tweezers. ? Clean your hands and the bite area with rubbing alcohol or soap and water.  Pregnant women should take special care to  avoid tick bites because the infection can be passed along to the fetus. Contact a health care provider if:  You have symptoms after treatment.  You have removed a tick and want to bring it to your health care provider for testing. Get help right away if:  You have an irregular heartbeat.  You have nerve pain.  Your face feels numb. This information is not intended to replace advice given to you by your health care provider. Make  sure you discuss any questions you have with your health care provider. Document Released: 08/06/2000 Document Revised: 12/20/2015 Document Reviewed: 12/20/2015 Elsevier Interactive Patient Education  2018 [place] Spotted Fever [facility] spotted fever is an illness that is spread to people by infected ticks. The illness causes flulike symptoms and a reddish-purple rash. This illness can quickly become very serious. Treatment must be started right away. When the illness is not treated right away, it can sometimes lead to long-term health problems or even death. This illness is most common during warm weather when ticks are most active. What are the causes? [facility] spotted fever is caused by a type of bacteria that is called Rickettsia rickettsii. This type of bacteria is carried by [place] dog ticks and [employer]. People get infected through a bite from a tick that is infected with the bacteria. The bite is painless, and it frequently goes unnoticed. The bacteria can also infect a person when tick blood or tick feces get into a person's body through damaged skin. A tick bite is not necessary for an infection to occur. People can get [facility] spotted fever if they get a tick's blood or body fluids on their skin in the area of a small cut or sore. This could happen while removing a tick from another person or a dog. The infection is not contagious, and it cannot be spread (transmitted) from person to person. What are the signs or symptoms? Symptoms may begin 2-14 days after a tick bite. The most common early symptoms are:  Fever.  Muscle aches.  Headache.  Nausea.  Vomiting.  Poor appetite.  Abdominal pain.  The reddish-purple rash usually appears 3-5 days after the first symptoms begin. The rash often starts on the wrists and ankles. It may then spread to the palms, the soles of the feet, the legs, and the trunk. How is this  diagnosed? Diagnosis is based on a physical exam, medical history, and blood tests. Your health care provider may suspect [facility] spotted fever in one of these cases:  If you have recently been bitten by a tick.  If you have been in areas that have a lot of ticks or in areas where the disease is common.  How is this treated? It is important to begin treatment right away. Treatment will usually involve the use of antibiotic medicines. In some cases, your health care provider may begin treatment before the diagnosis is confirmed. If your symptoms are severe, a hospital stay may be needed. Follow these instructions at home:  Rest as much as possible until you feel better.  Take medicines only as directed by your health care provider.  Take your antibiotic medicine as directed by your health care provider. Finish the antibiotic even if you start to feel better.  Drink enough fluid to keep your urine clear or pale yellow.  Keep all follow-up visits as directed by your health care provider. This is important. How is this  prevented? Avoiding tick bites can help to prevent this illness. Take these steps to avoid tick bites when you are outdoors:  Be aware that most ticks live in shrubs, low tree branches, and grassy areas. A tick can climb onto your body when you make contact with leaves or grass where the tick is waiting.  Wear protective clothing. Long sleeves and long pants are best.  Wear white clothes so you can see ticks more easily.  Tuck your pant legs into your socks.  If you go walking on a trail, stay in the middle of the trail to avoid brushing against bushes.  Avoid walking through areas that have long grass.  Put insect repellent on all exposed skin and along boot tops, pant legs, and sleeve cuffs.  Check clothing, hair, and skin repeatedly and before going inside.  Check family members and pets for ticks.  Brush off any ticks that are not attached.  Take a  shower or a bath as soon as possible after you have been outdoors. Check your skin for ticks. The most common places on the body where ticks attach themselves are the scalp, neck, armpits, waist, and groin.  You can also greatly reduce your chances of getting [facility] spotted fever if you remove attached ticks as soon as possible. To remove an attached tick, use a forceps or fine-point tweezers to detach the intact tick without leaving its mouth parts in the skin. The wound from the tick bite should be washed after the tick has been removed. Contact a health care provider if:  You have drainage, swelling, or increased redness or pain in the area of the rash. Get help right away if:  You have chest pain.  You have shortness of breath.  You have a severe headache.  You have a seizure.  You have severe abdominal pain.  You are feeling confused.  You are bruising easily.  You have bleeding from your gums.  You have blood in your stool. This information is not intended to replace advice given to you by your health care provider. Make sure you discuss any questions you have with your health care provider. Document Released: 08/12/2000 Document Revised: 10/06/2015 Document Reviewed: 12/14/2013 Elsevier Interactive Patient Education  2018 [employer].

## 2017-11-04 ENCOUNTER — Ambulatory Visit: Payer: Self-pay | Admitting: *Deleted

## 2017-11-04 NOTE — Telephone Encounter (Signed)
Pt had question to use hydrocortisone cream for rash only on back of both hands that it itching. She has been on doxycyline for about 18 days now and hands were exposed to the sun. No fever. She said that she was being treated for a tick bite, that she could possibly have lyme disease or RMSF. The rash was first noticed on Friday. She would like to know if there is anything else that she can do for the itching. She is requesting a call back please. Will route to [facility]. Pt advised to call back for increase in rash or itching or other symptoms.  Reason for Disposition . Caller has NON-URGENT medication question about med that PCP prescribed and triager unable to answer question  Answer Assessment - Initial Assessment Questions 1. SYMPTOMS: "Do you have any symptoms?"     Rash and itching  on back of hands 2. SEVERITY: If symptoms are present, ask "Are they mild, moderate or severe?"     moderated  Protocols used: MEDICATION QUESTION CALL-A-AH

## 2017-11-04 NOTE — Telephone Encounter (Signed)
Please call pt and clarify, phone note is difficult to understand.  Steroid cream can be used on any itchy rash (she had on her arm at the time I saw her).  I dont understand the comment about hands exposed to sun. Needs clarification.

## 2017-11-05 ENCOUNTER — Encounter: Payer: Self-pay | Admitting: Family Medicine

## 2017-11-05 NOTE — Telephone Encounter (Signed)
Left message for patient let her know if its just a rash she can use OTC cortisone cream for itching . If caused by sunburn call and let us know .

## 2017-11-18 ENCOUNTER — Telehealth: Payer: Self-pay | Admitting: Family Medicine

## 2017-11-18 NOTE — Telephone Encounter (Signed)
Copied from [place] [id number]. Topic: General - Other >> Nov 18, 2017 12:32 PM Cecelia Byars, NT wrote: Reason for CRM: Patient called and said she had a rash on her groin area that has cleared up ,after a week and the rash on there hands has also cleared [phone]

## 2017-11-18 NOTE — Telephone Encounter (Signed)
Noted  

## 2017-11-22 ENCOUNTER — Other Ambulatory Visit (INDEPENDENT_AMBULATORY_CARE_PROVIDER_SITE_OTHER): Payer: BLUE CROSS/BLUE SHIELD

## 2017-11-22 DIAGNOSIS — R590 Localized enlarged lymph nodes: Secondary | ICD-10-CM

## 2017-11-22 DIAGNOSIS — W57XXXA Bitten or stung by nonvenomous insect and other nonvenomous arthropods, initial encounter: Secondary | ICD-10-CM | POA: Diagnosis not present

## 2017-11-22 DIAGNOSIS — M791 Myalgia, unspecified site: Secondary | ICD-10-CM

## 2017-11-22 DIAGNOSIS — R21 Rash and other nonspecific skin eruption: Secondary | ICD-10-CM

## 2017-11-22 DIAGNOSIS — M2550 Pain in unspecified joint: Secondary | ICD-10-CM

## 2017-11-22 LAB — LDL CHOLESTEROL, DIRECT: LDL DIRECT: 139 mg/dL

## 2017-11-22 LAB — LIPID PANEL
CHOL/HDL RATIO: 6
CHOLESTEROL: 237 mg/dL — AB (ref 0–200)
HDL: 39.7 mg/dL (ref >39.00)
NonHDL: 196.84
TRIGLYCERIDES: 325 mg/dL — AB (ref 0.0–149.0)
VLDL: 65 mg/dL — ABNORMAL HIGH (ref 0.0–40.0)

## 2017-11-22 LAB — SEDIMENTATION RATE: SED RATE: 2 mm/h (ref 0–30)

## 2017-11-22 LAB — C-REACTIVE PROTEIN: CRP: 0.1 mg/dL — ABNORMAL LOW (ref 0.5–20.0)

## 2017-11-25 ENCOUNTER — Telehealth: Payer: Self-pay | Admitting: Family Medicine

## 2017-11-25 LAB — B. BURGDORFI ANTIBODIES: B burgdorferi Ab IgG+IgM: <0.9 index

## 2017-11-25 LAB — ROCKY MTN SPOTTED FVR ABS PNL(IGG+IGM)
RMSF IGG: NOT DETECTED
RMSF IGM: NOT DETECTED

## 2017-11-25 MED ORDER — FENOFIBRATE 145 MG PO TABS: 145.0000 mg | ORAL_TABLET | Freq: Every day | ORAL | 3 refills | Status: DC

## 2017-11-25 NOTE — Telephone Encounter (Signed)
Please inform patient the following information: She is negative for both Lyme disease and [facility] spotted fever. Triglycerides are extremely high at 325, this is actually higher than noted in April.  Indication indicated for high triglycerides as a fiber-based medicine, and I do recommend she start it is possible to help lower her cholesterol.  The dietary changes in the exercise, keep up with the dietary changes and exercise, this is likely genetic for her. Follow-up in 3 months for repeat fasting labs with provider, please schedule her.

## 2017-11-25 NOTE — Telephone Encounter (Signed)
Patient notified and verbalized understanding. 

## 2017-11-26 NOTE — Telephone Encounter (Signed)
Patient will call back to schedule appointment.

## 2018-01-06 DIAGNOSIS — M9903 Segmental and somatic dysfunction of lumbar region: Secondary | ICD-10-CM | POA: Diagnosis not present

## 2018-01-06 DIAGNOSIS — M9904 Segmental and somatic dysfunction of sacral region: Secondary | ICD-10-CM | POA: Diagnosis not present

## 2018-01-06 DIAGNOSIS — M9902 Segmental and somatic dysfunction of thoracic region: Secondary | ICD-10-CM | POA: Diagnosis not present

## 2018-01-06 DIAGNOSIS — M5136 Other intervertebral disc degeneration, lumbar region: Secondary | ICD-10-CM | POA: Diagnosis not present

## 2018-01-07 DIAGNOSIS — M5416 Radiculopathy, lumbar region: Secondary | ICD-10-CM | POA: Diagnosis not present

## 2018-01-08 DIAGNOSIS — M5136 Other intervertebral disc degeneration, lumbar region: Secondary | ICD-10-CM | POA: Diagnosis not present

## 2018-01-08 DIAGNOSIS — M9902 Segmental and somatic dysfunction of thoracic region: Secondary | ICD-10-CM | POA: Diagnosis not present

## 2018-01-08 DIAGNOSIS — M9903 Segmental and somatic dysfunction of lumbar region: Secondary | ICD-10-CM | POA: Diagnosis not present

## 2018-01-08 DIAGNOSIS — M9904 Segmental and somatic dysfunction of sacral region: Secondary | ICD-10-CM | POA: Diagnosis not present

## 2018-01-08 DIAGNOSIS — M5416 Radiculopathy, lumbar region: Secondary | ICD-10-CM | POA: Diagnosis not present

## 2018-01-14 DIAGNOSIS — M9902 Segmental and somatic dysfunction of thoracic region: Secondary | ICD-10-CM | POA: Diagnosis not present

## 2018-01-14 DIAGNOSIS — M5136 Other intervertebral disc degeneration, lumbar region: Secondary | ICD-10-CM | POA: Diagnosis not present

## 2018-01-14 DIAGNOSIS — M5416 Radiculopathy, lumbar region: Secondary | ICD-10-CM | POA: Diagnosis not present

## 2018-01-14 DIAGNOSIS — M9903 Segmental and somatic dysfunction of lumbar region: Secondary | ICD-10-CM | POA: Diagnosis not present

## 2018-01-14 DIAGNOSIS — M9904 Segmental and somatic dysfunction of sacral region: Secondary | ICD-10-CM | POA: Diagnosis not present

## 2018-01-16 DIAGNOSIS — M5416 Radiculopathy, lumbar region: Secondary | ICD-10-CM | POA: Diagnosis not present

## 2018-01-17 DIAGNOSIS — M545 Low back pain: Secondary | ICD-10-CM | POA: Diagnosis not present

## 2018-01-17 DIAGNOSIS — M5417 Radiculopathy, lumbosacral region: Secondary | ICD-10-CM | POA: Diagnosis not present

## 2018-01-21 DIAGNOSIS — M5416 Radiculopathy, lumbar region: Secondary | ICD-10-CM | POA: Diagnosis not present

## 2018-01-22 DIAGNOSIS — M170 Bilateral primary osteoarthritis of knee: Secondary | ICD-10-CM | POA: Diagnosis not present

## 2018-01-22 DIAGNOSIS — M25561 Pain in right knee: Secondary | ICD-10-CM | POA: Insufficient documentation

## 2018-01-22 DIAGNOSIS — M1712 Unilateral primary osteoarthritis, left knee: Secondary | ICD-10-CM | POA: Diagnosis not present

## 2018-01-22 DIAGNOSIS — M1711 Unilateral primary osteoarthritis, right knee: Secondary | ICD-10-CM | POA: Diagnosis not present

## 2018-01-23 DIAGNOSIS — M5416 Radiculopathy, lumbar region: Secondary | ICD-10-CM | POA: Diagnosis not present

## 2018-01-27 DIAGNOSIS — M5136 Other intervertebral disc degeneration, lumbar region: Secondary | ICD-10-CM | POA: Diagnosis not present

## 2018-01-27 DIAGNOSIS — M9903 Segmental and somatic dysfunction of lumbar region: Secondary | ICD-10-CM | POA: Diagnosis not present

## 2018-01-27 DIAGNOSIS — M9904 Segmental and somatic dysfunction of sacral region: Secondary | ICD-10-CM | POA: Diagnosis not present

## 2018-01-27 DIAGNOSIS — M9902 Segmental and somatic dysfunction of thoracic region: Secondary | ICD-10-CM | POA: Diagnosis not present

## 2018-01-28 DIAGNOSIS — M5416 Radiculopathy, lumbar region: Secondary | ICD-10-CM | POA: Diagnosis not present

## 2018-01-30 ENCOUNTER — Other Ambulatory Visit: Payer: Self-pay | Admitting: *Deleted

## 2018-01-30 DIAGNOSIS — M5416 Radiculopathy, lumbar region: Secondary | ICD-10-CM | POA: Diagnosis not present

## 2018-01-30 MED ORDER — FENOFIBRATE 145 MG PO TABS: 145.0000 mg | ORAL_TABLET | Freq: Every day | ORAL | 2 refills | Status: DC

## 2018-02-10 DIAGNOSIS — M170 Bilateral primary osteoarthritis of knee: Secondary | ICD-10-CM | POA: Diagnosis not present

## 2018-02-11 DIAGNOSIS — M5416 Radiculopathy, lumbar region: Secondary | ICD-10-CM | POA: Diagnosis not present

## 2018-02-13 ENCOUNTER — Ambulatory Visit: Payer: Self-pay

## 2018-02-13 DIAGNOSIS — M5416 Radiculopathy, lumbar region: Secondary | ICD-10-CM | POA: Diagnosis not present

## 2018-02-13 NOTE — Telephone Encounter (Signed)
Returned call to patient who states that she has been treating a rash to her abdomine x3 weeks. Pt states that she thinks it was caused by prednisone 6 day dose pack. She states that it was prescribed along with cortisone injection for sciatica issues. She has been off the med for 3 weeks and was calling for advice. "I just want to make sure I don't make it worse". Pt is treating the rash oatmeal baths, and Claritin D and peperment.  She states that the rash was all over but now just to her abdomin. It is dry and " improving". She rates itching as mild "3" Pt states she has no fever but does have sneezing and runny nose. Pt refused appointment stating that she will call back if it does not improve. She was curious about if Claritin D was her best choice. She questioned if benadryl would be better. Pt was cautioned that benadryl would cause sleepiness. I advised that pharmacy may be able to offer suggestions. She was advised to continue the baths. She again refused an appointment. Care advice given. Patient verbalized understanding.  Reason for Disposition . Rash started within 3 days after antibiotic stopped    Rash started after prednisone started  Answer Assessment - Initial Assessment Questions 1. APPEARANCE of RASH: "Describe the rash." (e.g., spots, blisters, raised areas, skin peeling, scaly)     Itch hive 2. SIZE: "How big are the spots?" (e.g., tip of pen, eraser, coin; inches, centimeters)     Palm of hand 3. LOCATION: "Where is the rash located?"     abdomine 4. COLOR: "What color is the rash?" (Note: It is difficult to assess rash color in people with darker-colored skin. When this situation occurs, simply ask the caller to describe what they see.)     Red  dry 5. ONSET: "When did the rash begin?"    3 weeks ago 6. FEVER: "Do you have a fever?" If so, ask: "What is your temperature, how was it measured, and when did it start?"     no 7. ITCHING: "Does the rash itch?" If so, ask: "How  bad is the itch?" (Scale 1-10; or mild, moderate, severe)     3 8. CAUSE: "What do you think is causing the rash?" Prednisone 9. NEW MEDICATION: "What new medication are you taking?" (e.g., name of antibiotic) "When did you start taking this medication?".     Claritin D 10. OTHER SYMPTOMS: "Do you have any other symptoms?" (e.g., sore throat, fever, joint pain)       no 11. PREGNANCY: "Is there any chance you are pregnant?" "When was your last menstrual period?"       No menapause  Protocols used: RASH - WIDESPREAD ON DRUGS-A-AH

## 2018-02-17 ENCOUNTER — Ambulatory Visit: Payer: BLUE CROSS/BLUE SHIELD | Admitting: Family Medicine

## 2018-02-17 ENCOUNTER — Encounter: Payer: Self-pay | Admitting: Family Medicine

## 2018-02-17 VITALS — BP 124/74 | HR 83 | Temp 98.2°F | Resp 20 | Ht 63.0 in | Wt 152.2 lb

## 2018-02-17 DIAGNOSIS — R252 Cramp and spasm: Secondary | ICD-10-CM | POA: Diagnosis not present

## 2018-02-17 DIAGNOSIS — B372 Candidiasis of skin and nail: Secondary | ICD-10-CM

## 2018-02-17 DIAGNOSIS — R21 Rash and other nonspecific skin eruption: Secondary | ICD-10-CM

## 2018-02-17 MED ORDER — CLOTRIMAZOLE 1 % EX CREA: 1.0000 "application " | TOPICAL_CREAM | Freq: Two times a day (BID) | CUTANEOUS | 0 refills | Status: DC

## 2018-02-17 NOTE — Patient Instructions (Addendum)
Stop fenofibrate for 3-4 weeks. If cramps not improved, restart. Try coconut water.  Start cream on rash. This is an antifungal cream.    Skin Yeast Infection Skin yeast infection is a condition in which there is an overgrowth of yeast (candida) that normally lives on the skin. This condition usually occurs in areas of the skin that are constantly warm and moist, such as the armpits or the groin. What are the causes? This condition is caused by a change in the normal balance of the yeast and bacteria that live on the skin. What increases the risk? This condition is more likely to develop in:  People who are obese.  Pregnant women.  Women who take birth control pills.  People who have diabetes.  People who take antibiotic medicines.  People who take steroid medicines.  People who are malnourished.  People who have a weak defense (immune) system.  People who are 4 years of age or older.  What are the signs or symptoms? Symptoms of this condition include:  A red, swollen area of the skin.  Bumps on the skin.  Itchiness.  How is this diagnosed? This condition is diagnosed with a medical history and physical exam. Your health care provider may check for yeast by taking light scrapings of the skin to be viewed under a microscope. How is this treated? This condition is treated with medicine. Medicines may be prescribed or be available over-the-counter. The medicines may be:  Taken by mouth (orally).  Applied as a cream.  Follow these instructions at home:  Take or apply over-the-counter and prescription medicines only as told by your health care provider.  Eat more yogurt. This may help to keep your yeast infection from returning.  Maintain a healthy weight. If you need help losing weight, talk with your health care provider.  Keep your skin clean and dry.  If you have diabetes, keep your blood sugar under control. Contact a health care provider if:  Your  symptoms go away and then return.  Your symptoms do not get better with treatment.  Your symptoms get worse.  Your rash spreads.  You have a fever or chills.  You have new symptoms.  You have new warmth or redness of your skin. This information is not intended to replace advice given to you by your health care provider. Make sure you discuss any questions you have with your health care provider. Document Released: 01/16/2011 Document Revised: 12/25/2015 Document Reviewed: 11/01/2014 Elsevier Interactive Patient Education  [employer].

## 2018-02-17 NOTE — Progress Notes (Signed)
Sarah Velazquez, Sarah Velazquez 1960/11/27, 57 y.o., female MRN: [id number] Patient Care Team    Relationship Specialty Notifications Start End  Sarah Hillock, DO PCP - General Family Medicine  08/03/15   Pyrtle, Lajuan Lines, MD Consulting Physician Gastroenterology  08/21/17     Chief Complaint  Patient presents with  . Rash    torso and under breasts x 3 weeks     Subjective: Pt presents for an OV with complaints of rash that started about 3 weeks under both breast. The rash is itchy. It has spread to area on her stomach and suprapubic area. The rash started after a few rounds of steroid for her msk pains. She also complans of leg cramps since starting fenofibrate. She has increased her potassium and fluids and it has improved, but not resolved.    Depression screen [facility] 2/9 10/31/2017 08/21/2017 08/20/2016  Decreased Interest 0 0 0  Down, Depressed, Hopeless 0 0 0  PHQ - 2 Score 0 0 0    Allergies  Allergen Reactions  . Sulfa Antibiotics    Social History   Tobacco Use  . Smoking status: Never Smoker  . Smokeless tobacco: Never Used  Substance Use Topics  . Alcohol use: Yes    Alcohol/week: 3.0 standard drinks    Types: 3 Glasses of wine per week    Comment: occasionally   Past Medical History:  Diagnosis Date  . Allergy    seasonal, dogs, cats, horses, hay  . Arthritis    OA knees   . Asthma   . Bell's palsy in high school   right  . Cervical cancer screening 04/08/2012  . Chicken pox as a child  . Heart murmur    as child  . History of neutropenia    age [age], received gamma globulin monthly; with multiple hosp admisson for illness (per pt)  . Hyperglycemia   . Hyperlipidemia   . Hyperplastic colon polyp 04/09/2012  . Lipoma    growing slowly since 20s  . Menopause 2007  . Migraines   . Mumps as a child  . Shingles    right cheek  . Thrombocytosis ([place]) 04/09/2012  . Vitamin D deficiency    Past Surgical History:  Procedure Laterality Date  . CESAREAN SECTION  05-10-87    . COLONOSCOPY    . cyst removed     twice on back and 1 on foot  . DILATION AND CURETTAGE OF UTERUS  1989   incomplete miscarriage with second pregnancy and again post menopausal  . INJECTION KNEE Bilateral 09/29/15   Gel  . POLYPECTOMY    . TONSILLECTOMY  as a child   Family History  Problem Relation Age of Onset  . Stroke Mother        X 3  . Heart attack Mother   . Diabetes Mother        type 2  . Anxiety disorder Mother   . Hypertension Mother   . Hyperlipidemia Mother   . Heart disease Mother   . Cancer Mother        breast, skin  . Colon polyps Mother   . Breast cancer Mother   . Stroke Father        several  . Heart attack Father        several  . Kidney disease Father        on dialysis  . Hypertension Father   . Hyperlipidemia Father   . Cancer Father  mouth X 2/ chewed tobacco, and skin  . Anxiety disorder Father   . Colon polyps Father   . Squamous cell carcinoma Father   . Hypertension Brother   . Hyperlipidemia Brother   . Depression Brother   . Cancer Maternal Grandmother        breast  . Diabetes Maternal Grandmother        type 2  . Obesity Maternal Grandmother   . Breast cancer Maternal Grandmother   . Cancer Paternal Grandmother        lung/ didn't smoke  . Heart attack Paternal Grandfather   . Lung cancer Paternal Grandfather   . Colon cancer Neg Hx   . Rectal cancer Neg Hx   . Stomach cancer Neg Hx    Allergies as of 02/17/2018      Reactions   Sulfa Antibiotics       Medication List        Accurate as of 02/17/18  2:51 PM. Always use your most recent med list.          B-1 100 MG Tabs Take 1 tablet by mouth daily.   CINNAMON PO Take 278 mg by mouth daily.   fenofibrate 145 MG tablet Commonly known as:  TRICOR Take 1 tablet (145 mg total) by mouth daily.   OMEGA 3-6-9 PO Take 450 mg by mouth daily.   POTASSIMIN PO Take 99 mg by mouth daily.   TART CHERRY ADVANCED PO Take 2,000 mg by mouth daily.   TURMERIC  PO Take 253 mg by mouth daily.   Vitamin B-12 500 MCG Subl Place under the tongue. Spray daily       All past medical history, surgical history, allergies, family history, immunizations andmedications were updated in the EMR today and reviewed under the history and medication portions of their EMR.     ROS: Negative, with the exception of above mentioned in HPI   Objective:  BP 124/74 (BP Location: Left Arm, Patient Position: Sitting, Cuff Size: Normal)   Pulse 83   Temp 98.2 F (36.8 C)   Resp 20   Ht 5\' 3"  (1.6 m)   Wt 152 lb 4 oz (69.1 kg)   SpO2 96%   BMI 26.97 kg/m  Body mass index is 26.97 kg/m. Gen: Afebrile. No acute distress. Nontoxic in appearance, well developed, well nourished.  Skin: red based fine raised red rash bilateral mammary fold, mid abd fold and suprapubic fold. No  purpura or petechiae.  Neuro: Normal gait. PERLA. EOMi. Alert. Oriented x3  Psych: Normal affect, dress and demeanor. Normal speech. Normal thought content and judgment.  No exam data present No results found. No results found for this or any previous visit (from the past 24 hour(s)).  Assessment/Plan: Sarah Velazquez is a 57 y.o. female present for OV for  Rash/Yeast dermatitis Appears to be yeast related. Could be from steroid use. Last a1c borderline, not a diabetic.  - clotrimazole prescribed. Keep area dry. New towel/hygeine daily.  - F/U PRN  Leg cramps:  - trial off fenofibrate for 3-4 weeks. If cramps not resolved restart. - could also try increasing water and/or coconut water prior to stopping fenofibrate to see if works.  - f/u PRN   Reviewed expectations re: course of current medical issues.  Discussed self-management of symptoms.  Outlined signs and symptoms indicating need for more acute intervention.  Patient verbalized understanding and all questions were answered.  Patient received an After-Visit Summary.    No  orders of the defined types were placed in this  encounter.    Note is dictated utilizing voice recognition software. Although note has been proof read prior to signing, occasional typographical errors still can be missed. If any questions arise, please do not hesitate to call for verification.   electronically signed by:  Sarah Pouch, DO  [place]

## 2018-02-18 DIAGNOSIS — M5416 Radiculopathy, lumbar region: Secondary | ICD-10-CM | POA: Diagnosis not present

## 2018-02-20 DIAGNOSIS — M5416 Radiculopathy, lumbar region: Secondary | ICD-10-CM | POA: Diagnosis not present

## 2018-02-21 DIAGNOSIS — M9903 Segmental and somatic dysfunction of lumbar region: Secondary | ICD-10-CM | POA: Diagnosis not present

## 2018-02-21 DIAGNOSIS — M9902 Segmental and somatic dysfunction of thoracic region: Secondary | ICD-10-CM | POA: Diagnosis not present

## 2018-02-21 DIAGNOSIS — M9904 Segmental and somatic dysfunction of sacral region: Secondary | ICD-10-CM | POA: Diagnosis not present

## 2018-02-21 DIAGNOSIS — M5136 Other intervertebral disc degeneration, lumbar region: Secondary | ICD-10-CM | POA: Diagnosis not present

## 2018-02-24 DIAGNOSIS — M9902 Segmental and somatic dysfunction of thoracic region: Secondary | ICD-10-CM | POA: Diagnosis not present

## 2018-02-24 DIAGNOSIS — M9903 Segmental and somatic dysfunction of lumbar region: Secondary | ICD-10-CM | POA: Diagnosis not present

## 2018-02-24 DIAGNOSIS — M9904 Segmental and somatic dysfunction of sacral region: Secondary | ICD-10-CM | POA: Diagnosis not present

## 2018-02-24 DIAGNOSIS — M5136 Other intervertebral disc degeneration, lumbar region: Secondary | ICD-10-CM | POA: Diagnosis not present

## 2018-02-25 DIAGNOSIS — M5416 Radiculopathy, lumbar region: Secondary | ICD-10-CM | POA: Diagnosis not present

## 2018-02-26 DIAGNOSIS — M9903 Segmental and somatic dysfunction of lumbar region: Secondary | ICD-10-CM | POA: Diagnosis not present

## 2018-02-26 DIAGNOSIS — M9902 Segmental and somatic dysfunction of thoracic region: Secondary | ICD-10-CM | POA: Diagnosis not present

## 2018-02-26 DIAGNOSIS — M5136 Other intervertebral disc degeneration, lumbar region: Secondary | ICD-10-CM | POA: Diagnosis not present

## 2018-02-26 DIAGNOSIS — M9904 Segmental and somatic dysfunction of sacral region: Secondary | ICD-10-CM | POA: Diagnosis not present

## 2018-02-28 DIAGNOSIS — M9902 Segmental and somatic dysfunction of thoracic region: Secondary | ICD-10-CM | POA: Diagnosis not present

## 2018-02-28 DIAGNOSIS — M9903 Segmental and somatic dysfunction of lumbar region: Secondary | ICD-10-CM | POA: Diagnosis not present

## 2018-02-28 DIAGNOSIS — M9904 Segmental and somatic dysfunction of sacral region: Secondary | ICD-10-CM | POA: Diagnosis not present

## 2018-02-28 DIAGNOSIS — M5136 Other intervertebral disc degeneration, lumbar region: Secondary | ICD-10-CM | POA: Diagnosis not present

## 2018-03-03 DIAGNOSIS — M9904 Segmental and somatic dysfunction of sacral region: Secondary | ICD-10-CM | POA: Diagnosis not present

## 2018-03-03 DIAGNOSIS — M9903 Segmental and somatic dysfunction of lumbar region: Secondary | ICD-10-CM | POA: Diagnosis not present

## 2018-03-03 DIAGNOSIS — M5136 Other intervertebral disc degeneration, lumbar region: Secondary | ICD-10-CM | POA: Diagnosis not present

## 2018-03-03 DIAGNOSIS — M9902 Segmental and somatic dysfunction of thoracic region: Secondary | ICD-10-CM | POA: Diagnosis not present

## 2018-03-04 DIAGNOSIS — M5416 Radiculopathy, lumbar region: Secondary | ICD-10-CM | POA: Diagnosis not present

## 2018-03-05 DIAGNOSIS — M5136 Other intervertebral disc degeneration, lumbar region: Secondary | ICD-10-CM | POA: Diagnosis not present

## 2018-03-05 DIAGNOSIS — M9902 Segmental and somatic dysfunction of thoracic region: Secondary | ICD-10-CM | POA: Diagnosis not present

## 2018-03-05 DIAGNOSIS — M9904 Segmental and somatic dysfunction of sacral region: Secondary | ICD-10-CM | POA: Diagnosis not present

## 2018-03-05 DIAGNOSIS — M9903 Segmental and somatic dysfunction of lumbar region: Secondary | ICD-10-CM | POA: Diagnosis not present

## 2018-03-07 DIAGNOSIS — M9903 Segmental and somatic dysfunction of lumbar region: Secondary | ICD-10-CM | POA: Diagnosis not present

## 2018-03-07 DIAGNOSIS — M5136 Other intervertebral disc degeneration, lumbar region: Secondary | ICD-10-CM | POA: Diagnosis not present

## 2018-03-07 DIAGNOSIS — M9902 Segmental and somatic dysfunction of thoracic region: Secondary | ICD-10-CM | POA: Diagnosis not present

## 2018-03-07 DIAGNOSIS — M9904 Segmental and somatic dysfunction of sacral region: Secondary | ICD-10-CM | POA: Diagnosis not present

## 2018-03-10 DIAGNOSIS — M9902 Segmental and somatic dysfunction of thoracic region: Secondary | ICD-10-CM | POA: Diagnosis not present

## 2018-03-10 DIAGNOSIS — M9904 Segmental and somatic dysfunction of sacral region: Secondary | ICD-10-CM | POA: Diagnosis not present

## 2018-03-10 DIAGNOSIS — M5136 Other intervertebral disc degeneration, lumbar region: Secondary | ICD-10-CM | POA: Diagnosis not present

## 2018-03-10 DIAGNOSIS — M9903 Segmental and somatic dysfunction of lumbar region: Secondary | ICD-10-CM | POA: Diagnosis not present

## 2018-03-11 DIAGNOSIS — M5416 Radiculopathy, lumbar region: Secondary | ICD-10-CM | POA: Diagnosis not present

## 2018-03-17 DIAGNOSIS — M5416 Radiculopathy, lumbar region: Secondary | ICD-10-CM | POA: Diagnosis not present

## 2018-03-17 DIAGNOSIS — M9902 Segmental and somatic dysfunction of thoracic region: Secondary | ICD-10-CM | POA: Diagnosis not present

## 2018-03-17 DIAGNOSIS — M5136 Other intervertebral disc degeneration, lumbar region: Secondary | ICD-10-CM | POA: Diagnosis not present

## 2018-03-17 DIAGNOSIS — M9904 Segmental and somatic dysfunction of sacral region: Secondary | ICD-10-CM | POA: Diagnosis not present

## 2018-03-17 DIAGNOSIS — M9903 Segmental and somatic dysfunction of lumbar region: Secondary | ICD-10-CM | POA: Diagnosis not present

## 2018-03-24 DIAGNOSIS — M9904 Segmental and somatic dysfunction of sacral region: Secondary | ICD-10-CM | POA: Diagnosis not present

## 2018-03-24 DIAGNOSIS — M9903 Segmental and somatic dysfunction of lumbar region: Secondary | ICD-10-CM | POA: Diagnosis not present

## 2018-03-24 DIAGNOSIS — M9902 Segmental and somatic dysfunction of thoracic region: Secondary | ICD-10-CM | POA: Diagnosis not present

## 2018-03-24 DIAGNOSIS — M5136 Other intervertebral disc degeneration, lumbar region: Secondary | ICD-10-CM | POA: Diagnosis not present

## 2018-03-31 DIAGNOSIS — M9904 Segmental and somatic dysfunction of sacral region: Secondary | ICD-10-CM | POA: Diagnosis not present

## 2018-03-31 DIAGNOSIS — M9902 Segmental and somatic dysfunction of thoracic region: Secondary | ICD-10-CM | POA: Diagnosis not present

## 2018-03-31 DIAGNOSIS — M5136 Other intervertebral disc degeneration, lumbar region: Secondary | ICD-10-CM | POA: Diagnosis not present

## 2018-03-31 DIAGNOSIS — M9903 Segmental and somatic dysfunction of lumbar region: Secondary | ICD-10-CM | POA: Diagnosis not present

## 2018-04-04 IMAGING — MG 2D DIGITAL DIAGNOSTIC UNILATERAL LEFT MAMMOGRAM WITH CAD AND ADJ
6 series · 6 of 14 positions shown · non-contrast
Comparison: Previous exam(s).

CLINICAL DATA: Six month re-evaluation of probably benign left
breast finding.

EXAM:
2D DIGITAL DIAGNOSTIC LEFT MAMMOGRAM WITH CAD AND ADJUNCT TOMO
ULTRASOUND LEFT BREAST

[L MLO]
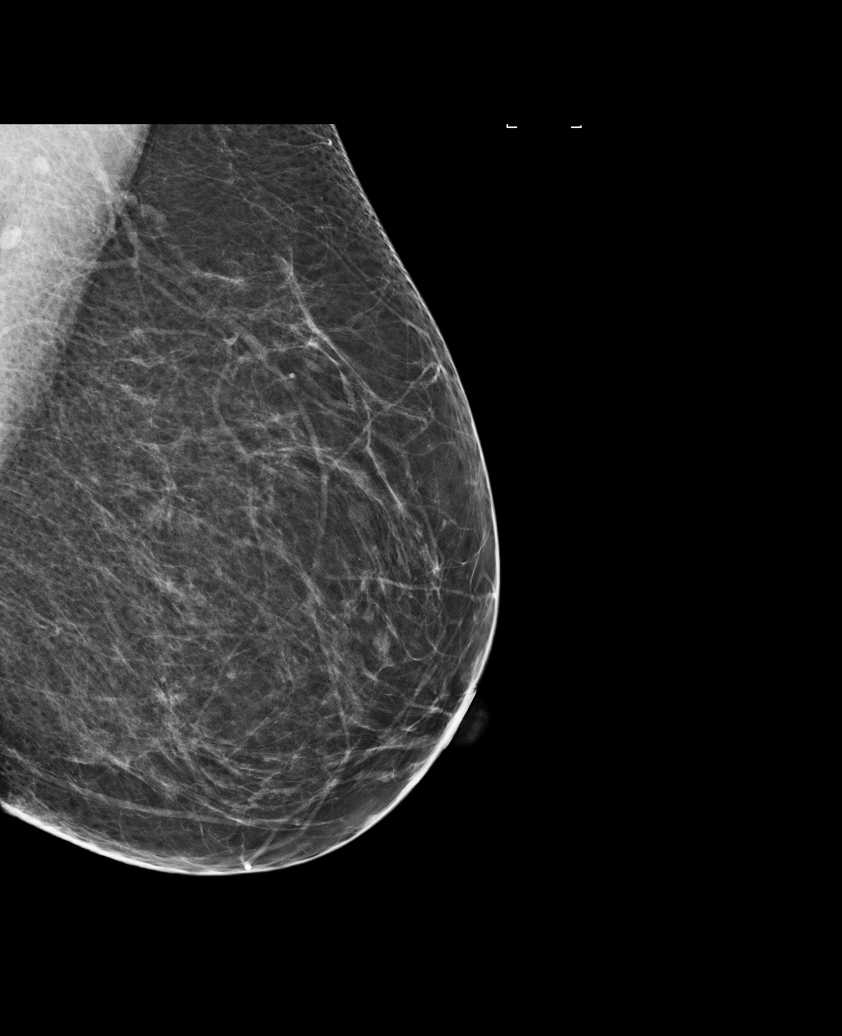

[L CC]
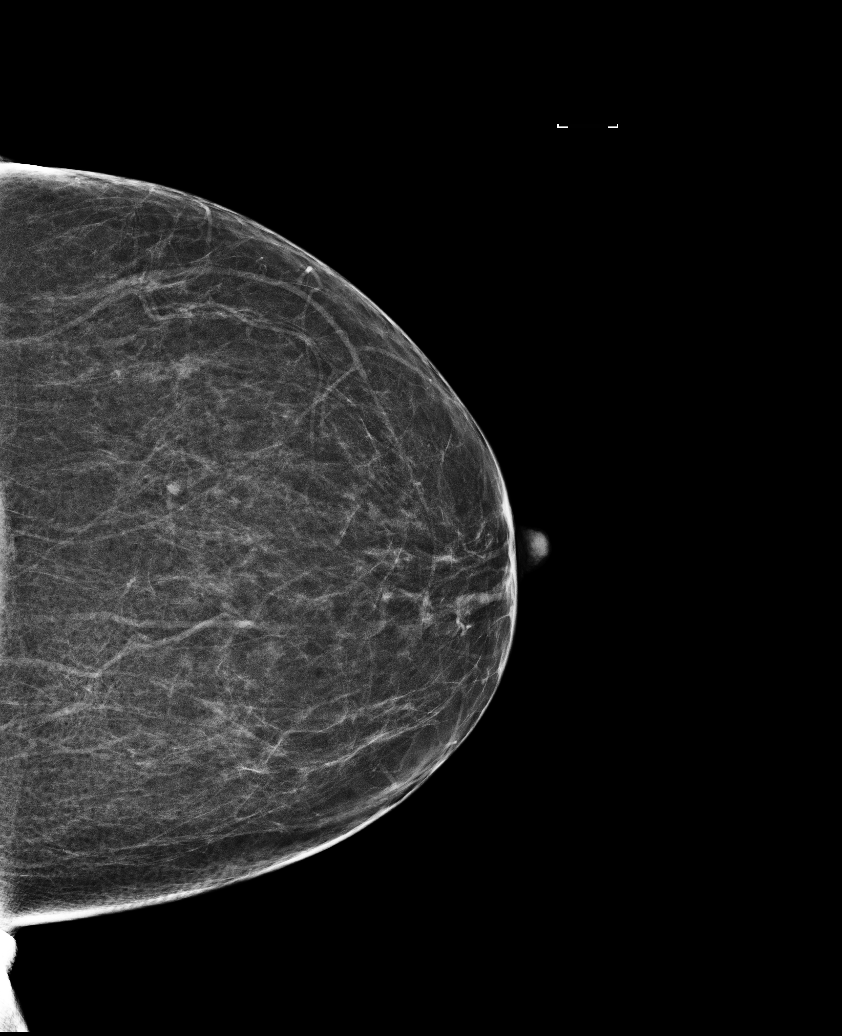

[L MLO synth-2D]
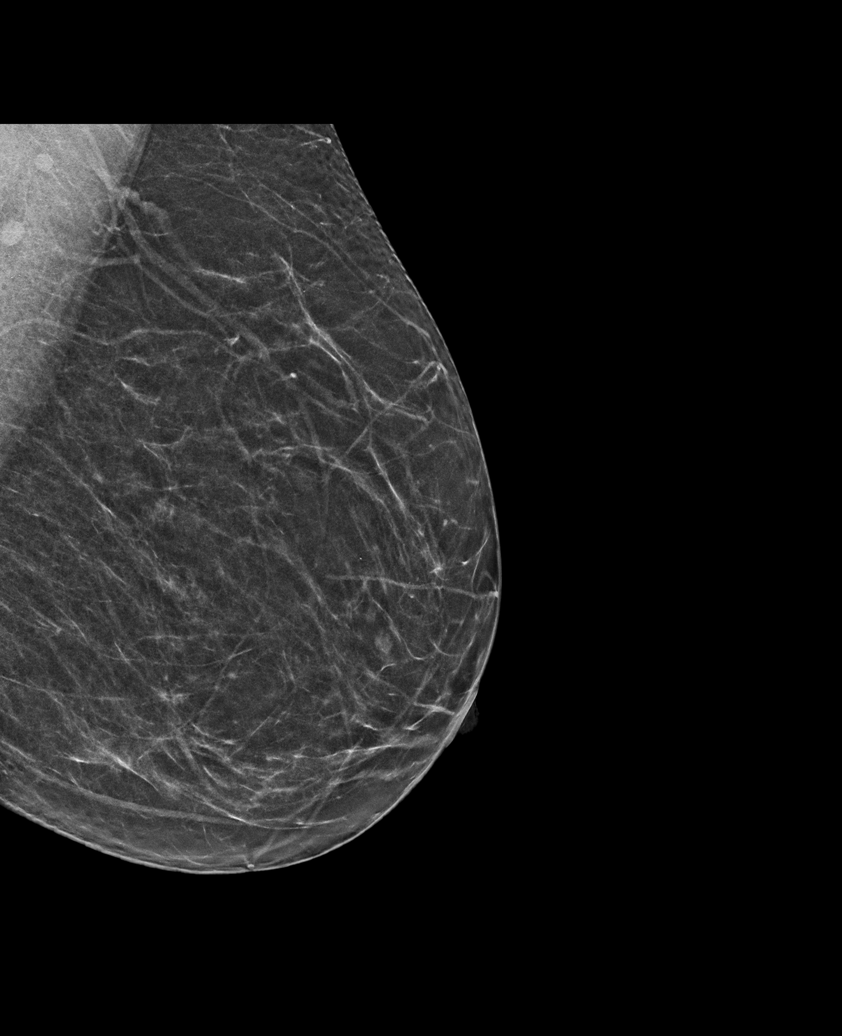

[L CC synth-2D]
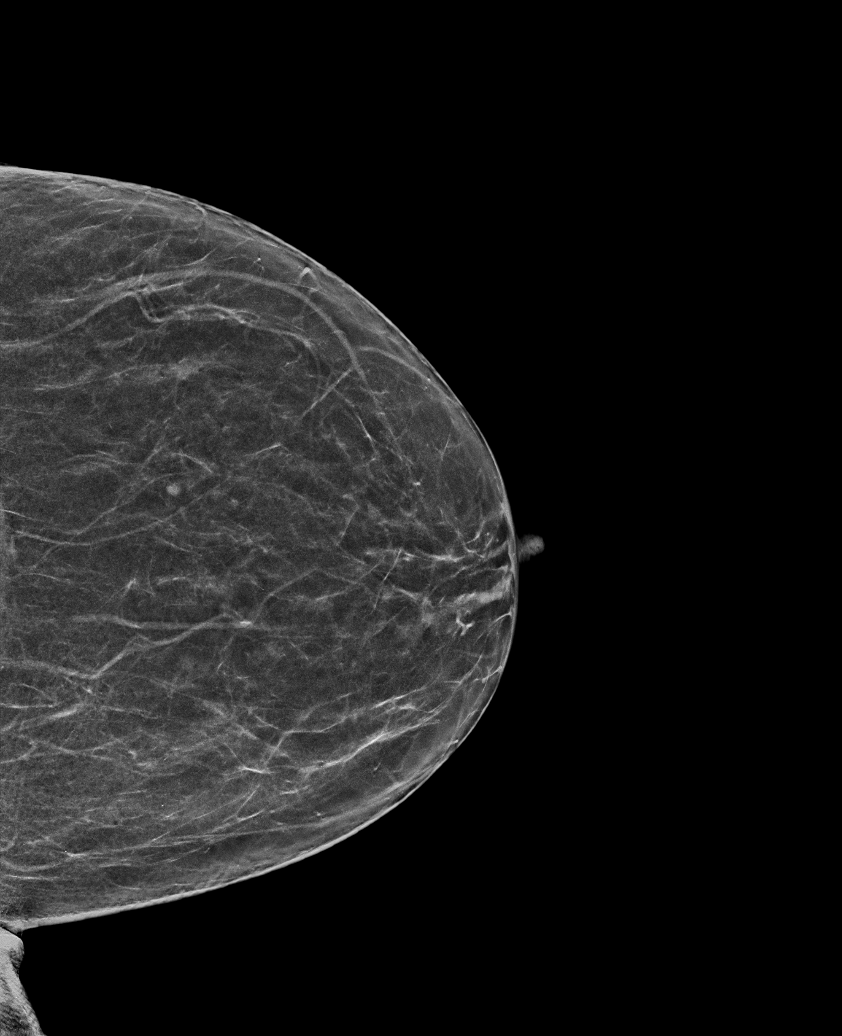

[L MLO tomo · tomo slice 31/62.0]
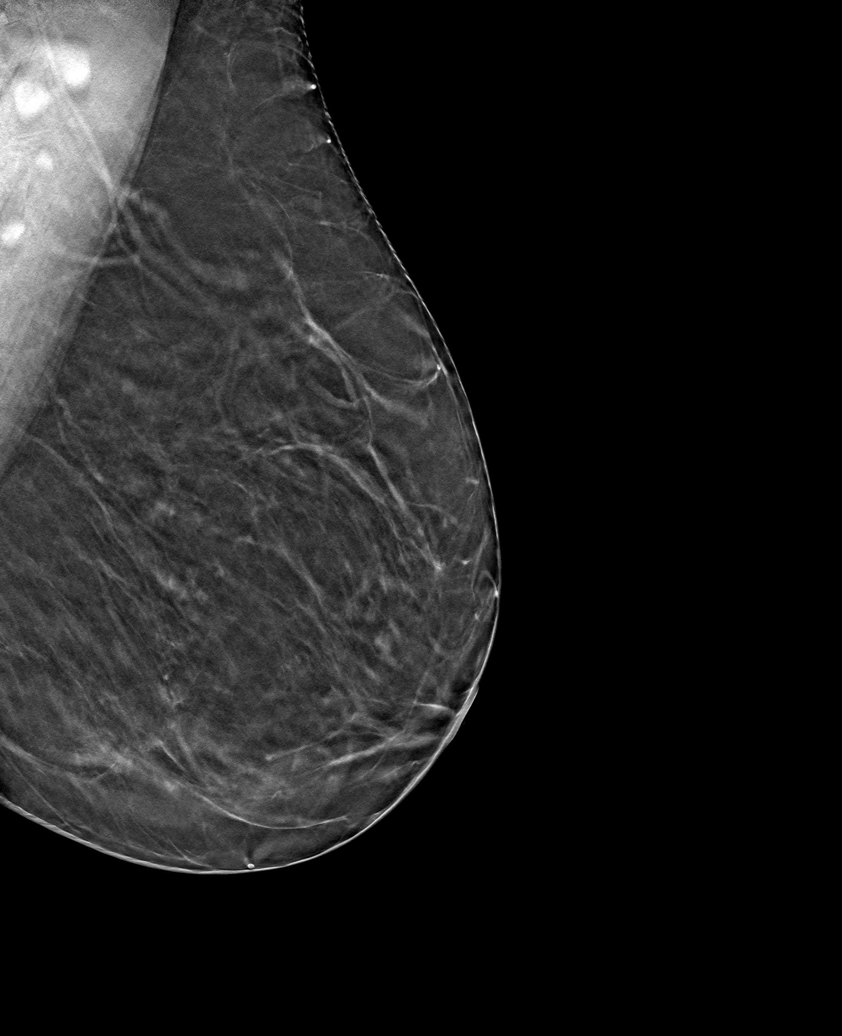

[L CC tomo · tomo slice 32/63.0]
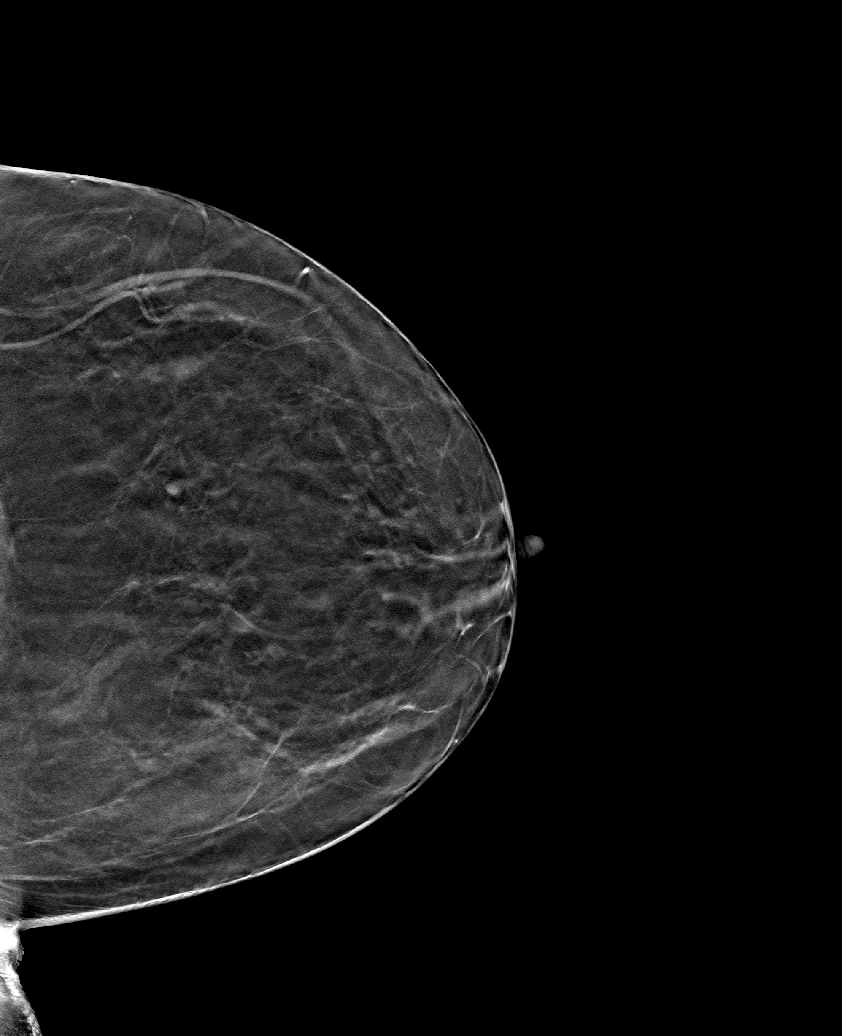

[6 of 14 positions shown; findings below may reference images not displayed]

ACR Breast Density Category b: There are scattered areas of
fibroglandular density.
FINDINGS: The left breast parenchymal pattern is stable. There is no worrisome
mass, distortion, or worrisome calcification.

Mammographic images were processed with CAD.

Targeted ultrasound is performed, showing a small benign-appearing
cyst located within the left breast at the 9 o'clock position 7 cm
from the nipple measuring 3 x 3 x 1 mm in size. A second tiny
adjacent benign-appearing cyst is present within the left breast at
the 9 o'clock position 6 cm from the nipple measuring 2 mm in size.
These are stable. There are no additional findings.
IMPRESSION: Stable left breast parenchymal pattern and stable tiny
benign-appearing cysts located within the left breast at 9 o'clock
position as discussed above. Recommend bilateral diagnostic
mammography and possibly left breast ultrasound in 6 months.

RECOMMENDATION:
Bilateral diagnostic mammography and possibly left breast ultrasound
in 6 months.

I have discussed the findings and recommendations with the patient.
Results were also provided in writing at the conclusion of the
visit. If applicable, a reminder letter will be sent to the patient
regarding the next appointment.

BI-RADS CATEGORY  3: Probably benign.

## 2018-04-07 DIAGNOSIS — M9903 Segmental and somatic dysfunction of lumbar region: Secondary | ICD-10-CM | POA: Diagnosis not present

## 2018-04-07 DIAGNOSIS — M5136 Other intervertebral disc degeneration, lumbar region: Secondary | ICD-10-CM | POA: Diagnosis not present

## 2018-04-07 DIAGNOSIS — M9902 Segmental and somatic dysfunction of thoracic region: Secondary | ICD-10-CM | POA: Diagnosis not present

## 2018-04-07 DIAGNOSIS — M9904 Segmental and somatic dysfunction of sacral region: Secondary | ICD-10-CM | POA: Diagnosis not present

## 2018-04-08 ENCOUNTER — Other Ambulatory Visit: Payer: Self-pay | Admitting: Family Medicine

## 2018-04-08 DIAGNOSIS — Z1231 Encounter for screening mammogram for malignant neoplasm of breast: Secondary | ICD-10-CM

## 2018-04-14 DIAGNOSIS — M9902 Segmental and somatic dysfunction of thoracic region: Secondary | ICD-10-CM | POA: Diagnosis not present

## 2018-04-14 DIAGNOSIS — M9904 Segmental and somatic dysfunction of sacral region: Secondary | ICD-10-CM | POA: Diagnosis not present

## 2018-04-14 DIAGNOSIS — M5136 Other intervertebral disc degeneration, lumbar region: Secondary | ICD-10-CM | POA: Diagnosis not present

## 2018-04-14 DIAGNOSIS — M9903 Segmental and somatic dysfunction of lumbar region: Secondary | ICD-10-CM | POA: Diagnosis not present

## 2018-04-21 DIAGNOSIS — M5136 Other intervertebral disc degeneration, lumbar region: Secondary | ICD-10-CM | POA: Diagnosis not present

## 2018-04-21 DIAGNOSIS — M9902 Segmental and somatic dysfunction of thoracic region: Secondary | ICD-10-CM | POA: Diagnosis not present

## 2018-04-21 DIAGNOSIS — M9903 Segmental and somatic dysfunction of lumbar region: Secondary | ICD-10-CM | POA: Diagnosis not present

## 2018-04-21 DIAGNOSIS — M9904 Segmental and somatic dysfunction of sacral region: Secondary | ICD-10-CM | POA: Diagnosis not present

## 2018-05-01 DIAGNOSIS — M5136 Other intervertebral disc degeneration, lumbar region: Secondary | ICD-10-CM | POA: Diagnosis not present

## 2018-05-01 DIAGNOSIS — M9903 Segmental and somatic dysfunction of lumbar region: Secondary | ICD-10-CM | POA: Diagnosis not present

## 2018-05-01 DIAGNOSIS — M9902 Segmental and somatic dysfunction of thoracic region: Secondary | ICD-10-CM | POA: Diagnosis not present

## 2018-05-01 DIAGNOSIS — M9904 Segmental and somatic dysfunction of sacral region: Secondary | ICD-10-CM | POA: Diagnosis not present

## 2018-05-05 DIAGNOSIS — M5136 Other intervertebral disc degeneration, lumbar region: Secondary | ICD-10-CM | POA: Diagnosis not present

## 2018-05-05 DIAGNOSIS — M9904 Segmental and somatic dysfunction of sacral region: Secondary | ICD-10-CM | POA: Diagnosis not present

## 2018-05-05 DIAGNOSIS — M9903 Segmental and somatic dysfunction of lumbar region: Secondary | ICD-10-CM | POA: Diagnosis not present

## 2018-05-05 DIAGNOSIS — M9902 Segmental and somatic dysfunction of thoracic region: Secondary | ICD-10-CM | POA: Diagnosis not present

## 2018-05-09 DIAGNOSIS — M9902 Segmental and somatic dysfunction of thoracic region: Secondary | ICD-10-CM | POA: Diagnosis not present

## 2018-05-09 DIAGNOSIS — M5136 Other intervertebral disc degeneration, lumbar region: Secondary | ICD-10-CM | POA: Diagnosis not present

## 2018-05-09 DIAGNOSIS — M9903 Segmental and somatic dysfunction of lumbar region: Secondary | ICD-10-CM | POA: Diagnosis not present

## 2018-05-09 DIAGNOSIS — M9904 Segmental and somatic dysfunction of sacral region: Secondary | ICD-10-CM | POA: Diagnosis not present

## 2018-05-12 DIAGNOSIS — M5136 Other intervertebral disc degeneration, lumbar region: Secondary | ICD-10-CM | POA: Diagnosis not present

## 2018-05-12 DIAGNOSIS — M9902 Segmental and somatic dysfunction of thoracic region: Secondary | ICD-10-CM | POA: Diagnosis not present

## 2018-05-12 DIAGNOSIS — M9904 Segmental and somatic dysfunction of sacral region: Secondary | ICD-10-CM | POA: Diagnosis not present

## 2018-05-12 DIAGNOSIS — M9903 Segmental and somatic dysfunction of lumbar region: Secondary | ICD-10-CM | POA: Diagnosis not present

## 2018-05-15 DIAGNOSIS — M1710 Unilateral primary osteoarthritis, unspecified knee: Secondary | ICD-10-CM | POA: Insufficient documentation

## 2018-05-15 DIAGNOSIS — M179 Osteoarthritis of knee, unspecified: Secondary | ICD-10-CM | POA: Insufficient documentation

## 2018-05-16 DIAGNOSIS — M170 Bilateral primary osteoarthritis of knee: Secondary | ICD-10-CM | POA: Diagnosis not present

## 2018-05-22 ENCOUNTER — Ambulatory Visit: Payer: BLUE CROSS/BLUE SHIELD

## 2018-05-23 ENCOUNTER — Ambulatory Visit: Payer: BLUE CROSS/BLUE SHIELD | Admitting: Family Medicine

## 2018-05-23 ENCOUNTER — Telehealth: Payer: Self-pay | Admitting: Family Medicine

## 2018-05-23 ENCOUNTER — Encounter: Payer: Self-pay | Admitting: Family Medicine

## 2018-05-23 VITALS — BP 129/79 | HR 86 | Temp 98.5°F | Resp 16 | Ht 63.0 in | Wt 154.0 lb

## 2018-05-23 DIAGNOSIS — Z01818 Encounter for other preprocedural examination: Secondary | ICD-10-CM | POA: Diagnosis not present

## 2018-05-23 DIAGNOSIS — M25561 Pain in right knee: Secondary | ICD-10-CM | POA: Diagnosis not present

## 2018-05-23 DIAGNOSIS — G8929 Other chronic pain: Secondary | ICD-10-CM

## 2018-05-23 DIAGNOSIS — R7309 Other abnormal glucose: Secondary | ICD-10-CM

## 2018-05-23 DIAGNOSIS — M1711 Unilateral primary osteoarthritis, right knee: Secondary | ICD-10-CM

## 2018-05-23 DIAGNOSIS — M25562 Pain in left knee: Secondary | ICD-10-CM

## 2018-05-23 LAB — CBC
HEMATOCRIT: 39.9 % (ref 36.0–46.0)
Hemoglobin: 13.5 g/dL (ref 12.0–15.0)
MCHC: 33.9 g/dL (ref 30.0–36.0)
MCV: 87.8 fl (ref 78.0–100.0)
PLATELETS: 359 10*3/uL (ref 150.0–400.0)
RBC: 4.54 Mil/uL (ref 3.87–5.11)
RDW: 12.6 % (ref 11.5–15.5)
WBC: 7 10*3/uL (ref 4.0–10.5)

## 2018-05-23 LAB — COMPREHENSIVE METABOLIC PANEL
ALBUMIN: 4.7 g/dL (ref 3.5–5.2)
ALK PHOS: 60 U/L (ref 39–117)
ALT: 15 U/L (ref 0–35)
AST: 15 U/L (ref 0–37)
BUN: 9 mg/dL (ref 6–23)
CALCIUM: 10 mg/dL (ref 8.4–10.5)
CHLORIDE: 105 meq/L (ref 96–112)
CO2: 27 mEq/L (ref 19–32)
Creatinine, Ser: 0.77 mg/dL (ref 0.40–1.20)
GFR: 81.99 mL/min (ref >60.00)
Glucose, Bld: 83 mg/dL (ref 70–99)
POTASSIUM: 4.3 meq/L (ref 3.5–5.1)
Sodium: 139 mEq/L (ref 135–145)
TOTAL PROTEIN: 7 g/dL (ref 6.0–8.3)
Total Bilirubin: 0.4 mg/dL (ref 0.2–1.2)

## 2018-05-23 LAB — HEMOGLOBIN A1C: Hgb A1c MFr Bld: 5.7 % (ref 4.6–6.5)

## 2018-05-23 NOTE — Patient Instructions (Addendum)
We will finish your paperwork- clearance after lab results and fax back your clearance.   Sarah Velazquez!!!   Please help [place] help you:  We are honored you have chosen [facility] for your Primary Care home. Below you will find basic instructions that you may need to access in the future. Please help [place] help you by reading the instructions, which cover many of the frequent questions we experience.   Prescription refills and request:  -In order to allow more efficient response time, please call your pharmacy for all refills. They will forward the request electronically to [place]. This allows for the quickest possible response. Request left on a nurse line can take longer to refill, since these are checked as time allows between office patients and other phone calls.  - refill request can take up to 3-5 working days to complete.  - If request is sent electronically and request is appropiate, it is usually completed in 1-2 business days.  - all patients will need to be seen routinely for all chronic medical conditions requiring prescription medications (see follow-up below). If you are overdue for follow up on your condition, you will be asked to make an appointment and we will call in enough medication to cover you until your appointment (up to 30 days).  - all controlled substances will require a face to face visit to request/refill.  - if you desire your prescriptions to go through a new pharmacy, and have an active script at original pharmacy, you will need to call your pharmacy and have scripts transferred to new pharmacy. This is completed between the pharmacy locations and not by your provider.    Results: If any images or labs were ordered, it can take up to 1 week to get results depending on the test ordered and the lab/facility running and resulting the test. - Normal or stable results, which do not need further discussion, may be released to your mychart immediately with attached note to you. A  call may not be generated for normal results. Please make certain to sign up for mychart. If you have questions on how to activate your mychart you can call the front office.  - If your results need further discussion, our office will attempt to contact you via phone, and if unable to reach you after 2 attempts, we will release your abnormal result to your mychart with instructions.  - All results will be automatically released in mychart after 1 week.  - Your provider will provide you with explanation and instruction on all relevant material in your results. Please keep in mind, results and labs may appear confusing or abnormal to the untrained eye, but it does not mean they are actually abnormal for you personally. If you have any questions about your results that are not covered, or you desire more detailed explanation than what was provided, you should make an appointment with your provider to do so.   Our office handles many outgoing and incoming calls daily. If we have not contacted you within 1 week about your results, please check your mychart to see if there is a message first and if not, then contact our office.  In helping with this matter, you help decrease call volume, and therefore allow [place] to be able to respond to patients needs more efficiently.   Acute office visits (sick visit):  An acute visit is intended for a new problem and are scheduled in shorter time slots to allow schedule openings for  patients with new problems. This is the appropriate visit to discuss a new problem. Problems will not be addressed by phone call or Echart message. Appointment is needed if requesting treatment. In order to provide you with excellent quality medical care with proper time for you to explain your problem, have an exam and receive treatment with instructions, these appointments should be limited to one new problem per visit. If you experience a new problem, in which you desire to be addressed, please  make an acute office visit, we save openings on the schedule to accommodate you. Please do not save your new problem for any other type of visit, let us take care of it properly and quickly for you.   Follow up visits:  Depending on your condition(s) your provider will need to see you routinely in order to provide you with quality care and prescribe medication(s). Most chronic conditions (Example: hypertension, Diabetes, depression/anxiety... etc), require visits a couple times a year. Your provider will instruct you on proper follow up for your personal medical conditions and history. Please make certain to make follow up appointments for your condition as instructed. Failing to do so could result in lapse in your medication treatment/refills. If you request a refill, and are overdue to be seen on a condition, we will always provide you with a 30 day script (once) to allow you time to schedule.    Medicare wellness (well visit): - we have a wonderful Nurse Maudie Mercury), that will meet with you and provide you will yearly medicare wellness visits. These visits should occur yearly (can not be scheduled less than 1 calendar year apart) and cover preventive health, immunizations, advance directives and screenings you are entitled to yearly through your medicare benefits. Do not miss out on your entitled benefits, this is when medicare will pay for these benefits to be ordered for you.  These are strongly encouraged by your provider and is the appropriate type of visit to make certain you are up to date with all preventive health benefits. If you have not had your medicare wellness exam in the last 12 months, please make certain to schedule one by calling the office and schedule your medicare wellness with Maudie Mercury as soon as possible.   Yearly physical (well visit):  - Adults are recommended to be seen yearly for physicals. Check with your insurance and date of your last physical, most insurances require one calendar  year between physicals. Physicals include all preventive health topics, screenings, medical exam and labs that are appropriate for gender/age and history. You may have fasting labs needed at this visit. This is a well visit (not a sick visit), new problems should not be covered during this visit (see acute visit).  - Pediatric patients are seen more frequently when they are younger. Your provider will advise you on well child visit timing that is appropriate for your their age. - This is not a medicare wellness visit. Medicare wellness exams do not have an exam portion to the visit. Some medicare companies allow for a physical, some do not allow a yearly physical. If your medicare allows a yearly physical you can schedule the medicare wellness with our nurse Maudie Mercury and have your physical with your provider after, on the same day. Please check with insurance for your full benefits.   Late Policy/No Shows:  - all new patients should arrive 15-30 minutes earlier than appointment to allow [place] time  to  obtain all personal demographics,  insurance information and for  you to complete office paperwork. - All established patients should arrive 10-15 minutes earlier than appointment time to update all information and be checked in .  - In our best efforts to run on time, if you are late for your appointment you will be asked to either reschedule or if able, we will work you back into the schedule. There will be a wait time to work you back in the schedule,  depending on availability.  - If you are unable to make it to your appointment as scheduled, please call 24 hours ahead of time to allow [place] to fill the time slot with someone else who needs to be seen. If you do not cancel your appointment ahead of time, you may be charged a no show fee.

## 2018-05-23 NOTE — Telephone Encounter (Signed)
Please inform patient the following information: Her labs are normal. We will sign her form and return to ortho.  Their particular form ask for an EKG and urinalysis, which is not typically indicated in someone without heart disease or symptoms undergoing knee surgery. If they are requiring her to have these- then she will need to be seen again to complete them. I would have her ask if they require that of her.   Please send copies of note, clearance and all labs collected to her surgeon.

## 2018-05-23 NOTE — Progress Notes (Signed)
Sarah Velazquez, Sarah Velazquez 08/14/1960, 58 y.o., female MRN: [id number] Patient Care Team    Relationship Specialty Notifications Start End  Ma Hillock, DO PCP - General Family Medicine  08/03/15   Pyrtle, Lajuan Lines, MD Consulting Physician Gastroenterology  08/21/17     Chief Complaint  Patient presents with  . Knee Pain    Surgical Clearance for Rt Knee      Subjective:  Procedure: total knee arthroplasty, right  Indication: Severe right knee osteoarthritis Anesthesia: Spinal anesthesia anticipated.  - pt reports this may be completed outpatient and she has support with her daughter.  Surgery type risk:   - Intermediate risk= orthopedics Prior anesthesia complications: no Family history of prior anesthesia complications: Mother had difficulty waking  Cardiac:    - CAD, PAD, stroke, MI, aortic stenosis --> no PMH,.   - METs: > 10 METS   - EKG: Not indicated Pulmonary: allergy/exercise induced asthma- no complications.  Endocrine: a1c 5.8--> repeated today Overweight:Body mass index is 27.28 kg/m. Chronic kidney disease: N/A Chronic med that needs to be continued: none Anticoagulation: None  Depression screen [facility] 2/9 10/31/2017 08/21/2017 08/20/2016  Decreased Interest 0 0 0  Down, Depressed, Hopeless 0 0 0  PHQ - 2 Score 0 0 0    Allergies  Allergen Reactions  . Sulfa Antibiotics    Social History   Tobacco Use  . Smoking status: Never Smoker  . Smokeless tobacco: Never Used  Substance Use Topics  . Alcohol use: Yes    Alcohol/week: 3.0 standard drinks    Types: 3 Glasses of wine per week    Comment: occasionally   Past Medical History:  Diagnosis Date  . Allergy    seasonal, dogs, cats, horses, hay  . Arthritis    OA knees   . Asthma   . Bell's palsy in high school   right  . Cervical cancer screening 04/08/2012  . Chicken pox as a child  . Heart murmur    as child  . History of neutropenia    age [age], received gamma globulin monthly; with multiple hosp  admisson for illness (per pt)  . Hyperglycemia   . Hyperlipidemia   . Hyperplastic colon polyp 04/09/2012  . Lipoma    growing slowly since 20s  . Menopause 2007  . Migraines   . Mumps as a child  . Shingles    right cheek  . Thrombocytosis ([place]) 04/09/2012  . Vitamin D deficiency    Past Surgical History:  Procedure Laterality Date  . CESAREAN SECTION  05-10-87  . COLONOSCOPY    . cyst removed     twice on back and 1 on foot  . DILATION AND CURETTAGE OF UTERUS  1989   incomplete miscarriage with second pregnancy and again post menopausal  . INJECTION KNEE Bilateral 09/29/15   Gel  . POLYPECTOMY    . TONSILLECTOMY  as a child   Family History  Problem Relation Age of Onset  . Stroke Mother        X 3  . Heart attack Mother   . Diabetes Mother        type 2  . Anxiety disorder Mother   . Hypertension Mother   . Hyperlipidemia Mother   . Heart disease Mother   . Cancer Mother        breast, skin  . Colon polyps Mother   . Breast cancer Mother   . Stroke Father  several  . Heart attack Father        several  . Kidney disease Father        on dialysis  . Hypertension Father   . Hyperlipidemia Father   . Cancer Father        mouth X 2/ chewed tobacco, and skin  . Anxiety disorder Father   . Colon polyps Father   . Squamous cell carcinoma Father   . Hypertension Brother   . Hyperlipidemia Brother   . Depression Brother   . Cancer Maternal Grandmother        breast  . Diabetes Maternal Grandmother        type 2  . Obesity Maternal Grandmother   . Breast cancer Maternal Grandmother   . Cancer Paternal Grandmother        lung/ didn't smoke  . Heart attack Paternal Grandfather   . Lung cancer Paternal Grandfather   . Colon cancer Neg Hx   . Rectal cancer Neg Hx   . Stomach cancer Neg Hx    Allergies as of 05/23/2018      Reactions   Sulfa Antibiotics       Medication List       Accurate as of May 23, 2018 11:59 PM. Always use your most  recent med list.        B-1 100 MG Tabs Take 1 tablet by mouth daily.   CINNAMON PO Take 278 mg by mouth daily.   OMEGA 3-6-9 PO Take 450 mg by mouth daily.   POTASSIMIN PO Take 99 mg by mouth daily.   TART CHERRY ADVANCED PO Take 2,000 mg by mouth daily.   TURMERIC PO Take 253 mg by mouth daily.   Vitamin B-12 500 MCG Subl Place under the tongue. Spray daily       All past medical history, surgical history, allergies, family history, immunizations andmedications were updated in the EMR today and reviewed under the history and medication portions of their EMR.     ROS: Negative, with the exception of above mentioned in HPI  Objective:  BP 129/79 (BP Location: Right Arm, Patient Position: Sitting, Cuff Size: Normal)   Pulse 86   Temp 98.5 F (36.9 C) (Oral)   Resp 16   Ht _0  (1.6 m)   Wt 154 lb (69.9 kg)   SpO2 97%   BMI 27.28 kg/m  Body mass index is 27.28 kg/m. Gen: Afebrile. No acute distress. Nontoxic in appearance, well developed, well nourished.  HENT: AT. [place]. Bilateral TM visualized without erythema or fullness. MMM, no oral lesions. Bilateral nares without erythema swelling or drainage. Throat without erythema or exudates.  No cough. Eyes:Pupils Equal Round Reactive to light, Extraocular movements intact,  Conjunctiva without redness, discharge or icterus. Neck/lymp/endocrine: Supple, no lymphadenopathy CV: RRR no murmur, no edema Chest: CTAB, no wheeze or crackles. Good air movement, normal resp effort.  Abd: Soft.  Overweight. NTND. BS present.  No masses palpated. No rebound or guarding.  MSK: No erythema, no joint swelling.  Neuro-vascularly intact distally. Skin: No rashes, purpura or petechiae.  Warm well perfused.  Intact Neuro:  Normal gait. PERLA. EOMi. Alert.  Psych: Normal affect, dress and demeanor. Normal speech. Normal thought content and judgment.  Results for orders placed or performed in visit on 05/23/18 (from the past 72 hour(s))    HgB A1c     Status: None   Collection Time: 05/23/18  2:01 PM  Result Value Ref Range  Hgb A1c MFr Bld 5.7 4.6 - 6.5 %    Comment: Glycemic Control Guidelines for People with Diabetes:Non Diabetic:  <6%Goal of Therapy: <7%Additional Action Suggested:  >8%   CBC     Status: None   Collection Time: 05/23/18  2:01 PM  Result Value Ref Range   WBC 7.0 4.0 - 10.5 K/uL   RBC 4.54 3.87 - 5.11 Mil/uL   Platelets 359.0 150.0 - 400.0 K/uL   Hemoglobin 13.5 12.0 - 15.0 g/dL   HCT 39.9 36.0 - 46.0 %   MCV 87.8 78.0 - 100.0 fl   MCHC 33.9 30.0 - 36.0 g/dL   RDW 12.6 11.5 - 15.5 %  Comp Met (CMET)     Status: None   Collection Time: 05/23/18  2:01 PM  Result Value Ref Range   Sodium 139 135 - 145 mEq/L   Potassium 4.3 3.5 - 5.1 mEq/L   Chloride 105 96 - 112 mEq/L   CO2 27 19 - 32 mEq/L   Glucose, Bld 83 70 - 99 mg/dL   BUN 9 6 - 23 mg/dL   Creatinine, Ser 0.77 0.40 - 1.20 mg/dL   Total Bilirubin 0.4 0.2 - 1.2 mg/dL   Alkaline Phosphatase 60 39 - 117 U/L   AST 15 0 - 37 U/L   ALT 15 0 - 35 U/L   Total Protein 7.0 6.0 - 8.3 g/dL   Albumin 4.7 3.5 - 5.2 g/dL   Calcium 10.0 8.4 - 10.5 mg/dL   GFR 81.99 >60.00 mL/min    Assessment/Plan: Sarah Velazquez is a 58 y.o. female present for OV for  Pre-operative clearance/Arthralgia of both knees/Primary osteoarthritis of right knee -Patient is cleared for surgery, considered low risk.  CBC, CMP and hemoglobin A1c are within normal ranges.  Patient has support at home to help with her recovery from surgery. - CBC Elevated hemoglobin A1c - HgB A1c - Comp Met (CMET)  No patient is free of risk when undergoing a procedure. The decision about whether to proceed with the operation belongs to the surgeon and the patient.   Reviewed expectations re: course of current medical issues.  Discussed self-management of symptoms.  Outlined signs and symptoms indicating need for more acute intervention.  Patient verbalized understanding and all questions  were answered.  Patient received an After-Visit Summary.    Orders Placed This Encounter  Procedures  . HgB A1c  . CBC  . Comp Met (CMET)     Note is dictated utilizing voice recognition software. Although note has been proof read prior to signing, occasional typographical errors still can be missed. If any questions arise, please do not hesitate to call for verification.   electronically signed by:  Howard Pouch, DO  [place]

## 2018-05-26 ENCOUNTER — Encounter: Payer: Self-pay | Admitting: Family Medicine

## 2018-05-26 NOTE — Telephone Encounter (Signed)
Pt notified that form is complete and we will fax to orthopedics office. Pt was advised to call her orthopedics office to verify if she needs and EKG and a urinalysis,. Pt is to call back and schedule an appt if she needs these items completed as well.

## 2018-05-28 DIAGNOSIS — M25561 Pain in right knee: Secondary | ICD-10-CM | POA: Diagnosis not present

## 2018-05-28 DIAGNOSIS — M1711 Unilateral primary osteoarthritis, right knee: Secondary | ICD-10-CM | POA: Diagnosis not present

## 2018-05-30 DIAGNOSIS — M1711 Unilateral primary osteoarthritis, right knee: Secondary | ICD-10-CM | POA: Diagnosis not present

## 2018-06-05 DIAGNOSIS — M5441 Lumbago with sciatica, right side: Secondary | ICD-10-CM | POA: Diagnosis not present

## 2018-06-05 DIAGNOSIS — M9903 Segmental and somatic dysfunction of lumbar region: Secondary | ICD-10-CM | POA: Diagnosis not present

## 2018-06-05 DIAGNOSIS — M9901 Segmental and somatic dysfunction of cervical region: Secondary | ICD-10-CM | POA: Diagnosis not present

## 2018-06-05 DIAGNOSIS — M9905 Segmental and somatic dysfunction of pelvic region: Secondary | ICD-10-CM | POA: Diagnosis not present

## 2018-06-12 DIAGNOSIS — M9903 Segmental and somatic dysfunction of lumbar region: Secondary | ICD-10-CM | POA: Diagnosis not present

## 2018-06-12 DIAGNOSIS — M9901 Segmental and somatic dysfunction of cervical region: Secondary | ICD-10-CM | POA: Diagnosis not present

## 2018-06-12 DIAGNOSIS — M9905 Segmental and somatic dysfunction of pelvic region: Secondary | ICD-10-CM | POA: Diagnosis not present

## 2018-06-12 DIAGNOSIS — M5441 Lumbago with sciatica, right side: Secondary | ICD-10-CM | POA: Diagnosis not present

## 2018-06-13 ENCOUNTER — Ambulatory Visit
Admission: RE | Admit: 2018-06-13 | Discharge: 2018-06-13 | Disposition: A | Discharge status: Home / Self Care | Payer: BLUE CROSS/BLUE SHIELD | Source: Ambulatory Visit | Attending: Family Medicine | Admitting: Family Medicine

## 2018-06-13 DIAGNOSIS — Z1231 Encounter for screening mammogram for malignant neoplasm of breast: Secondary | ICD-10-CM | POA: Diagnosis not present

## 2018-06-17 DIAGNOSIS — M1711 Unilateral primary osteoarthritis, right knee: Secondary | ICD-10-CM | POA: Diagnosis not present

## 2018-07-17 DIAGNOSIS — M9902 Segmental and somatic dysfunction of thoracic region: Secondary | ICD-10-CM | POA: Diagnosis not present

## 2018-07-17 DIAGNOSIS — M9904 Segmental and somatic dysfunction of sacral region: Secondary | ICD-10-CM | POA: Diagnosis not present

## 2018-07-17 DIAGNOSIS — M5116 Intervertebral disc disorders with radiculopathy, lumbar region: Secondary | ICD-10-CM | POA: Diagnosis not present

## 2018-07-17 DIAGNOSIS — M9903 Segmental and somatic dysfunction of lumbar region: Secondary | ICD-10-CM | POA: Diagnosis not present

## 2018-08-07 DIAGNOSIS — M5116 Intervertebral disc disorders with radiculopathy, lumbar region: Secondary | ICD-10-CM | POA: Diagnosis not present

## 2018-08-07 DIAGNOSIS — M9902 Segmental and somatic dysfunction of thoracic region: Secondary | ICD-10-CM | POA: Diagnosis not present

## 2018-08-07 DIAGNOSIS — M9904 Segmental and somatic dysfunction of sacral region: Secondary | ICD-10-CM | POA: Diagnosis not present

## 2018-08-07 DIAGNOSIS — M9903 Segmental and somatic dysfunction of lumbar region: Secondary | ICD-10-CM | POA: Diagnosis not present

## 2018-08-11 DIAGNOSIS — M9902 Segmental and somatic dysfunction of thoracic region: Secondary | ICD-10-CM | POA: Diagnosis not present

## 2018-08-11 DIAGNOSIS — M9904 Segmental and somatic dysfunction of sacral region: Secondary | ICD-10-CM | POA: Diagnosis not present

## 2018-08-11 DIAGNOSIS — M5116 Intervertebral disc disorders with radiculopathy, lumbar region: Secondary | ICD-10-CM | POA: Diagnosis not present

## 2018-08-11 DIAGNOSIS — M9903 Segmental and somatic dysfunction of lumbar region: Secondary | ICD-10-CM | POA: Diagnosis not present

## 2018-08-14 DIAGNOSIS — M9902 Segmental and somatic dysfunction of thoracic region: Secondary | ICD-10-CM | POA: Diagnosis not present

## 2018-08-14 DIAGNOSIS — M9903 Segmental and somatic dysfunction of lumbar region: Secondary | ICD-10-CM | POA: Diagnosis not present

## 2018-08-14 DIAGNOSIS — M5116 Intervertebral disc disorders with radiculopathy, lumbar region: Secondary | ICD-10-CM | POA: Diagnosis not present

## 2018-08-14 DIAGNOSIS — M9904 Segmental and somatic dysfunction of sacral region: Secondary | ICD-10-CM | POA: Diagnosis not present

## 2018-08-19 DIAGNOSIS — M9902 Segmental and somatic dysfunction of thoracic region: Secondary | ICD-10-CM | POA: Diagnosis not present

## 2018-08-19 DIAGNOSIS — M9903 Segmental and somatic dysfunction of lumbar region: Secondary | ICD-10-CM | POA: Diagnosis not present

## 2018-08-19 DIAGNOSIS — M9904 Segmental and somatic dysfunction of sacral region: Secondary | ICD-10-CM | POA: Diagnosis not present

## 2018-08-19 DIAGNOSIS — M5116 Intervertebral disc disorders with radiculopathy, lumbar region: Secondary | ICD-10-CM | POA: Diagnosis not present

## 2018-08-25 DIAGNOSIS — Z96651 Presence of right artificial knee joint: Secondary | ICD-10-CM | POA: Insufficient documentation

## 2018-08-27 ENCOUNTER — Encounter: Payer: BLUE CROSS/BLUE SHIELD | Admitting: Family Medicine

## 2018-08-27 DIAGNOSIS — M9903 Segmental and somatic dysfunction of lumbar region: Secondary | ICD-10-CM | POA: Diagnosis not present

## 2018-08-27 DIAGNOSIS — M9902 Segmental and somatic dysfunction of thoracic region: Secondary | ICD-10-CM | POA: Diagnosis not present

## 2018-08-27 DIAGNOSIS — M5116 Intervertebral disc disorders with radiculopathy, lumbar region: Secondary | ICD-10-CM | POA: Diagnosis not present

## 2018-08-27 DIAGNOSIS — M9904 Segmental and somatic dysfunction of sacral region: Secondary | ICD-10-CM | POA: Diagnosis not present

## 2018-09-04 DIAGNOSIS — M5116 Intervertebral disc disorders with radiculopathy, lumbar region: Secondary | ICD-10-CM | POA: Diagnosis not present

## 2018-09-04 DIAGNOSIS — M9904 Segmental and somatic dysfunction of sacral region: Secondary | ICD-10-CM | POA: Diagnosis not present

## 2018-09-04 DIAGNOSIS — M9903 Segmental and somatic dysfunction of lumbar region: Secondary | ICD-10-CM | POA: Diagnosis not present

## 2018-09-04 DIAGNOSIS — M9902 Segmental and somatic dysfunction of thoracic region: Secondary | ICD-10-CM | POA: Diagnosis not present

## 2018-10-14 ENCOUNTER — Encounter: Payer: BLUE CROSS/BLUE SHIELD | Admitting: Family Medicine

## 2018-11-03 DIAGNOSIS — F411 Generalized anxiety disorder: Secondary | ICD-10-CM | POA: Diagnosis not present

## 2018-11-03 DIAGNOSIS — F341 Dysthymic disorder: Secondary | ICD-10-CM | POA: Diagnosis not present

## 2018-11-10 DIAGNOSIS — F341 Dysthymic disorder: Secondary | ICD-10-CM | POA: Diagnosis not present

## 2018-11-10 DIAGNOSIS — F411 Generalized anxiety disorder: Secondary | ICD-10-CM | POA: Diagnosis not present

## 2018-11-17 DIAGNOSIS — F411 Generalized anxiety disorder: Secondary | ICD-10-CM | POA: Diagnosis not present

## 2018-11-17 DIAGNOSIS — F341 Dysthymic disorder: Secondary | ICD-10-CM | POA: Diagnosis not present

## 2018-11-24 DIAGNOSIS — F411 Generalized anxiety disorder: Secondary | ICD-10-CM | POA: Diagnosis not present

## 2018-11-24 DIAGNOSIS — F341 Dysthymic disorder: Secondary | ICD-10-CM | POA: Diagnosis not present

## 2018-11-25 ENCOUNTER — Encounter: Payer: BLUE CROSS/BLUE SHIELD | Admitting: Family Medicine

## 2018-12-04 DIAGNOSIS — F411 Generalized anxiety disorder: Secondary | ICD-10-CM | POA: Diagnosis not present

## 2018-12-04 DIAGNOSIS — F341 Dysthymic disorder: Secondary | ICD-10-CM | POA: Diagnosis not present

## 2018-12-08 ENCOUNTER — Other Ambulatory Visit (HOSPITAL_COMMUNITY)
Admission: RE | Admit: 2018-12-08 | Discharge: 2018-12-08 | Disposition: A | Discharge status: Home / Self Care | Payer: BC Managed Care – PPO | Source: Ambulatory Visit | Attending: Family Medicine | Admitting: Family Medicine

## 2018-12-08 ENCOUNTER — Other Ambulatory Visit: Payer: Self-pay

## 2018-12-08 ENCOUNTER — Ambulatory Visit (INDEPENDENT_AMBULATORY_CARE_PROVIDER_SITE_OTHER): Payer: BC Managed Care – PPO | Admitting: Family Medicine

## 2018-12-08 ENCOUNTER — Telehealth: Payer: Self-pay | Admitting: Family Medicine

## 2018-12-08 ENCOUNTER — Encounter: Payer: Self-pay | Admitting: Family Medicine

## 2018-12-08 VITALS — BP 111/73 | HR 73 | Temp 98.3°F | Resp 17 | Ht 63.0 in | Wt 158.5 lb

## 2018-12-08 DIAGNOSIS — Z0000 Encounter for general adult medical examination without abnormal findings: Secondary | ICD-10-CM | POA: Diagnosis not present

## 2018-12-08 DIAGNOSIS — Z5320 Procedure and treatment not carried out because of patient's decision for unspecified reasons: Secondary | ICD-10-CM

## 2018-12-08 DIAGNOSIS — Z1239 Encounter for other screening for malignant neoplasm of breast: Secondary | ICD-10-CM

## 2018-12-08 DIAGNOSIS — Z130 Encounter for screening for diseases of the blood and blood-forming organs and certain disorders involving the immune mechanism: Secondary | ICD-10-CM

## 2018-12-08 DIAGNOSIS — M1712 Unilateral primary osteoarthritis, left knee: Secondary | ICD-10-CM

## 2018-12-08 DIAGNOSIS — E663 Overweight: Secondary | ICD-10-CM

## 2018-12-08 DIAGNOSIS — E785 Hyperlipidemia, unspecified: Secondary | ICD-10-CM | POA: Diagnosis not present

## 2018-12-08 DIAGNOSIS — R7309 Other abnormal glucose: Secondary | ICD-10-CM | POA: Diagnosis not present

## 2018-12-08 DIAGNOSIS — E559 Vitamin D deficiency, unspecified: Secondary | ICD-10-CM

## 2018-12-08 DIAGNOSIS — Z0289 Encounter for other administrative examinations: Secondary | ICD-10-CM

## 2018-12-08 DIAGNOSIS — Z01419 Encounter for gynecological examination (general) (routine) without abnormal findings: Secondary | ICD-10-CM | POA: Insufficient documentation

## 2018-12-08 LAB — CBC WITH DIFFERENTIAL/PLATELET
Basophils Absolute: 0.1 10*3/uL (ref 0.0–0.1)
Basophils Relative: 1.1 % (ref 0.0–3.0)
Eosinophils Absolute: 0 10*3/uL (ref 0.0–0.7)
Eosinophils Relative: 0.4 % (ref 0.0–5.0)
HCT: 40.4 % (ref 36.0–46.0)
Hemoglobin: 13.7 g/dL (ref 12.0–15.0)
Lymphocytes Relative: 36.3 % (ref 12.0–46.0)
Lymphs Abs: 2.4 10*3/uL (ref 0.7–4.0)
MCHC: 33.9 g/dL (ref 30.0–36.0)
MCV: 87.7 fl (ref 78.0–100.0)
Monocytes Absolute: 0.5 10*3/uL (ref 0.1–1.0)
Monocytes Relative: 7.4 % (ref 3.0–12.0)
Neutro Abs: 3.6 10*3/uL (ref 1.4–7.7)
Neutrophils Relative %: 54.8 % (ref 43.0–77.0)
Platelets: 340 10*3/uL (ref 150.0–400.0)
RBC: 4.6 Mil/uL (ref 3.87–5.11)
RDW: 13.1 % (ref 11.5–15.5)
WBC: 6.6 10*3/uL (ref 4.0–10.5)

## 2018-12-08 LAB — VITAMIN D 25 HYDROXY (VIT D DEFICIENCY, FRACTURES): VITD: 25.59 ng/mL — ABNORMAL LOW (ref 30.00–100.00)

## 2018-12-08 LAB — HM PAP SMEAR

## 2018-12-08 LAB — COMPREHENSIVE METABOLIC PANEL
ALT: 16 U/L (ref 0–35)
AST: 17 U/L (ref 0–37)
Albumin: 4.8 g/dL (ref 3.5–5.2)
Alkaline Phosphatase: 62 U/L (ref 39–117)
BUN: 12 mg/dL (ref 6–23)
CO2: 23 mEq/L (ref 19–32)
Calcium: 9.9 mg/dL (ref 8.4–10.5)
Chloride: 102 mEq/L (ref 96–112)
Creatinine, Ser: 0.77 mg/dL (ref 0.40–1.20)
GFR: 76.99 mL/min (ref >60.00)
Glucose, Bld: 88 mg/dL (ref 70–99)
Potassium: 4.2 mEq/L (ref 3.5–5.1)
Sodium: 136 mEq/L (ref 135–145)
Total Bilirubin: 0.6 mg/dL (ref 0.2–1.2)
Total Protein: 7.2 g/dL (ref 6.0–8.3)

## 2018-12-08 LAB — TSH: TSH: 1.37 u[IU]/mL (ref 0.35–4.50)

## 2018-12-08 LAB — LIPID PANEL
Cholesterol: 290 mg/dL — ABNORMAL HIGH (ref 0–200)
HDL: 45.9 mg/dL (ref >39.00)
NonHDL: 243.62
Total CHOL/HDL Ratio: 6
Triglycerides: 312 mg/dL — ABNORMAL HIGH (ref 0.0–149.0)
VLDL: 62.4 mg/dL — ABNORMAL HIGH (ref 0.0–40.0)

## 2018-12-08 LAB — LDL CHOLESTEROL, DIRECT: Direct LDL: 184 mg/dL

## 2018-12-08 LAB — HEMOGLOBIN A1C: Hgb A1c MFr Bld: 5.4 % (ref 4.6–6.5)

## 2018-12-08 NOTE — Patient Instructions (Signed)
Health Maintenance, Female Adopting a healthy lifestyle and getting preventive care are important in promoting health and wellness. Ask your health care provider about:  The right schedule for you to have regular tests and exams.  Things you can do on your own to prevent diseases and keep yourself healthy. What should I know about diet, weight, and exercise? Eat a healthy diet   Eat a diet that includes plenty of vegetables, fruits, low-fat dairy products, and lean protein.  Do not eat a lot of foods that are high in solid fats, added sugars, or sodium. Maintain a healthy weight Body mass index (BMI) is used to identify weight problems. It estimates body fat based on height and weight. Your health care provider can help determine your BMI and help you achieve or maintain a healthy weight. Get regular exercise Get regular exercise. This is one of the most important things you can do for your health. Most adults should:  Exercise for at least 150 minutes each week. The exercise should increase your heart rate and make you sweat (moderate-intensity exercise).  Do strengthening exercises at least twice a week. This is in addition to the moderate-intensity exercise.  Spend less time sitting. Even light physical activity can be beneficial. Watch cholesterol and blood lipids Have your blood tested for lipids and cholesterol at 58 years of age, then have this test every 5 years. Have your cholesterol levels checked more often if:  Your lipid or cholesterol levels are high.  You are older than 58 years of age.  You are at high risk for heart disease. What should I know about cancer screening? Depending on your health history and family history, you may need to have cancer screening at various ages. This may include screening for:  Breast cancer.  Cervical cancer.  Colorectal cancer.  Skin cancer.  Lung cancer. What should I know about heart disease, diabetes, and high blood  pressure? Blood pressure and heart disease  High blood pressure causes heart disease and increases the risk of stroke. This is more likely to develop in people who have high blood pressure readings, are of African descent, or are overweight.  Have your blood pressure checked: ? Every 3-5 years if you are 18-39 years of age. ? Every year if you are 40 years old or older. Diabetes Have regular diabetes screenings. This checks your fasting blood sugar level. Have the screening done:  Once every three years after age 40 if you are at a normal weight and have a low risk for diabetes.  More often and at a younger age if you are overweight or have a high risk for diabetes. What should I know about preventing infection? Hepatitis B If you have a higher risk for hepatitis B, you should be screened for this virus. Talk with your health care provider to find out if you are at risk for hepatitis B infection. Hepatitis C Testing is recommended for:  Everyone born from 1945 through 1965.  Anyone with known risk factors for hepatitis C. Sexually transmitted infections (STIs)  Get screened for STIs, including gonorrhea and chlamydia, if: ? You are sexually active and are younger than 58 years of age. ? You are older than 58 years of age and your health care provider tells you that you are at risk for this type of infection. ? Your sexual activity has changed since you were last screened, and you are at increased risk for chlamydia or gonorrhea. Ask your health care provider if   you are at risk.  Ask your health care provider about whether you are at high risk for HIV. Your health care provider may recommend a prescription medicine to help prevent HIV infection. If you choose to take medicine to prevent HIV, you should first get tested for HIV. You should then be tested every 3 months for as long as you are taking the medicine. Pregnancy  If you are about to stop having your period (premenopausal) and  you may become pregnant, seek counseling before you get pregnant.  Take 400 to 800 micrograms (mcg) of folic acid every day if you become pregnant.  Ask for birth control (contraception) if you want to prevent pregnancy. Osteoporosis and menopause Osteoporosis is a disease in which the bones lose minerals and strength with aging. This can result in bone fractures. If you are 65 years old or older, or if you are at risk for osteoporosis and fractures, ask your health care provider if you should:  Be screened for bone loss.  Take a calcium or vitamin D supplement to lower your risk of fractures.  Be given hormone replacement therapy (HRT) to treat symptoms of menopause. Follow these instructions at home: Lifestyle  Do not use any products that contain nicotine or tobacco, such as cigarettes, e-cigarettes, and chewing tobacco. If you need help quitting, ask your health care provider.  Do not use street drugs.  Do not share needles.  Ask your health care provider for help if you need support or information about quitting drugs. Alcohol use  Do not drink alcohol if: ? Your health care provider tells you not to drink. ? You are pregnant, may be pregnant, or are planning to become pregnant.  If you drink alcohol: ? Limit how much you use to 0-1 drink a day. ? Limit intake if you are breastfeeding.  Be aware of how much alcohol is in your drink. In the [place] one drink equals one 12 oz bottle of beer (355 mL), one 5 oz glass of wine (148 mL), or one 1 oz glass of hard liquor (44 mL). General instructions  Schedule regular health, dental, and eye exams.  Stay current with your vaccines.  Tell your health care provider if: ? You often feel depressed. ? You have ever been abused or do not feel safe at home. Summary  Adopting a healthy lifestyle and getting preventive care are important in promoting health and wellness.  Follow your health care provider's instructions about healthy  diet, exercising, and getting tested or screened for diseases.  Follow your health care provider's instructions on monitoring your cholesterol and blood pressure. This information is not intended to replace advice given to you by your health care provider. Make sure you discuss any questions you have with your health care provider. Document Released: 11/13/2010 Document Revised: 04/23/2018 Document Reviewed: 04/23/2018 [employer] Patient Education  2020 [employer]  

## 2018-12-08 NOTE — Progress Notes (Signed)
Patient ID: Sarah Velazquez, female  DOB: Feb 14, 1961, 58 y.o.   MRN: [id number] Patient Care Team    Relationship Specialty Notifications Start End  Ma Hillock, DO PCP - General Family Medicine  08/03/15   Pyrtle, Lajuan Lines, MD Consulting Physician Gastroenterology  08/21/17     Chief Complaint  Patient presents with  . Annual Exam    fasting. patient will need a medical clearance. mamm 05/2018 colonoscopy 04/2018    Subjective:  Sarah Velazquez is a 58 y.o.  Female  present for CPE w/ PAP. All past medical history, surgical history, allergies, family history, immunizations, medications and social history were updated in the electronic medical record today. All recent labs, ED visits and hospitalizations within the last year were reviewed.  Health maintenance:  Colonoscopy: 06/10/2017, no FHX of colon cancer, mother with colon polyps. F/U 5 years unclear.5 yr f/u.  Mammogram: 06/13/2018 normal. Breast center [place] Cervical cancer screening: Last PAP 09/27/2015, normal, neg co-testing >> collected today.  Immunizations: Td UTD 2013, declined influenza, declined shingrix Infectious disease screening: HIV and hep completed DEXA: 2012, normal, rpt 10 years   Assistive device: none Oxygen YQM:VHQI Patient has a Dental home. Hospitalizations/ED visits: reviewed  Depression screen [facility] 2/9 12/08/2018 10/31/2017 08/21/2017 08/20/2016  Decreased Interest 1 0 0 0  Down, Depressed, Hopeless 1 0 0 0  PHQ - 2 Score 2 0 0 0  Altered sleeping 0 - - -  Tired, decreased energy 0 - - -  Change in appetite 0 - - -  Feeling bad or failure about yourself  0 - - -  Trouble concentrating 0 - - -  Moving slowly or fidgety/restless 0 - - -  Suicidal thoughts 0 - - -  PHQ-9 Score 2 - - -  Difficult doing work/chores Somewhat difficult - - -   No flowsheet data found.   Immunization History  Administered Date(s) Administered  . Influenza Split 02/12/2012  . Influenza Whole 02/12/2011  . Tdap 09/13/2011   Past  Medical History:  Diagnosis Date  . Allergy    seasonal, dogs, cats, horses, hay  . Arthritis    OA knees   . Asthma   . Bell's palsy in high school   right  . Cervical cancer screening 04/08/2012  . Chicken pox as a child  . Heart murmur    as child  . History of neutropenia    age [age], received gamma globulin monthly; with multiple hosp admisson for illness (per pt)  . Hyperglycemia   . Hyperlipidemia   . Hyperplastic colon polyp 04/09/2012  . Lipoma    growing slowly since 20s  . Menopause 2007  . Migraines   . Mumps as a child  . Shingles    right cheek  . Thrombocytosis ([place]) 04/09/2012  . Vitamin D deficiency    Allergies  Allergen Reactions  . Sulfa Antibiotics    Past Surgical History:  Procedure Laterality Date  . CESAREAN SECTION  05-10-87  . COLONOSCOPY    . cyst removed     twice on back and 1 on foot  . DILATION AND CURETTAGE OF UTERUS  1989   incomplete miscarriage with second pregnancy and again post menopausal  . INJECTION KNEE Bilateral 09/29/15   Gel  . POLYPECTOMY    . TONSILLECTOMY  as a child   Family History  Problem Relation Age of Onset  . Stroke Mother        X 3  .  Heart attack Mother   . Diabetes Mother        type 2  . Anxiety disorder Mother   . Hypertension Mother   . Hyperlipidemia Mother   . Heart disease Mother   . Cancer Mother        breast, skin  . Colon polyps Mother   . Breast cancer Mother   . Stroke Father        several  . Heart attack Father        several  . Kidney disease Father        on dialysis  . Hypertension Father   . Hyperlipidemia Father   . Cancer Father        mouth X 2/ chewed tobacco, and skin  . Anxiety disorder Father   . Colon polyps Father   . Squamous cell carcinoma Father   . Hypertension Brother   . Hyperlipidemia Brother   . Depression Brother   . Cancer Maternal Grandmother        breast  . Diabetes Maternal Grandmother        type 2  . Obesity Maternal Grandmother   .  Breast cancer Maternal Grandmother   . Cancer Paternal Grandmother        lung/ didn't smoke  . Heart attack Paternal Grandfather   . Lung cancer Paternal Grandfather   . Colon cancer Neg Hx   . Rectal cancer Neg Hx   . Stomach cancer Neg Hx    Social History   Social History Narrative   Married to [place]. Has 3 children Jaci Standard, [place], [place])   Bachelors of science, homemaker.   Drinks caffeinated beverages, uses herbal remedies, takes a daily vitamin.   Wears her seatbelt, bicycle helmet and exercise is greater than 3 times a week.   Patient eats vegetarian meals 5 days a week, minimal meat product.   Smoke detector in the home, firearms in the home.   Feels safe in her relationships.    Allergies as of 12/08/2018      Reactions   Sulfa Antibiotics       Medication List       Accurate as of December 08, 2018 11:59 PM. If you have any questions, ask your nurse or doctor.        B-1 100 MG Tabs Take 1 tablet by mouth daily.   CINNAMON PO Take 278 mg by mouth daily.   OMEGA 3-6-9 PO Take 450 mg by mouth daily.   POTASSIMIN PO Take 99 mg by mouth daily.   rosuvastatin 20 MG tablet Commonly known as: Crestor Take 1 tablet (20 mg total) by mouth daily. Started by: Howard Pouch, DO   TART CHERRY ADVANCED PO Take 2,000 mg by mouth daily.   TURMERIC PO Take 253 mg by mouth daily.   Vitamin B-12 500 MCG Subl Place under the tongue. Spray daily       All past medical history, surgical history, allergies, family history, immunizations andmedications were updated in the EMR today and reviewed under the history and medication portions of their EMR.     Recent Results (from the past 2160 hour(s))  Cytology - PAP     Status: None   Collection Time: 12/08/18 12:00 AM  Result Value Ref Range   Adequacy      Satisfactory for evaluation  endocervical/transformation zone component PRESENT.   Diagnosis      NEGATIVE FOR INTRAEPITHELIAL LESIONS OR MALIGNANCY.   HPV NOT  Detected  Comment: Normal Reference Range - NOT Detected   Material Submitted CervicoVaginal Pap [ThinPrep Imaged]   CBC with Differential/Platelet     Status: None   Collection Time: 12/08/18  1:44 PM  Result Value Ref Range   WBC 6.6 4.0 - 10.5 K/uL   RBC 4.60 3.87 - 5.11 Mil/uL   Hemoglobin 13.7 12.0 - 15.0 g/dL   HCT 40.4 36.0 - 46.0 %   MCV 87.7 78.0 - 100.0 fl   MCHC 33.9 30.0 - 36.0 g/dL   RDW 13.1 11.5 - 15.5 %   Platelets 340.0 150.0 - 400.0 K/uL   Neutrophils Relative % 54.8 43.0 - 77.0 %   Lymphocytes Relative 36.3 12.0 - 46.0 %   Monocytes Relative 7.4 3.0 - 12.0 %   Eosinophils Relative 0.4 0.0 - 5.0 %   Basophils Relative 1.1 0.0 - 3.0 %   Neutro Abs 3.6 1.4 - 7.7 K/uL   Lymphs Abs 2.4 0.7 - 4.0 K/uL   Monocytes Absolute 0.5 0.1 - 1.0 K/uL   Eosinophils Absolute 0.0 0.0 - 0.7 K/uL   Basophils Absolute 0.1 0.0 - 0.1 K/uL  Comprehensive metabolic panel     Status: None   Collection Time: 12/08/18  1:44 PM  Result Value Ref Range   Sodium 136 135 - 145 mEq/L   Potassium 4.2 3.5 - 5.1 mEq/L   Chloride 102 96 - 112 mEq/L   CO2 23 19 - 32 mEq/L   Glucose, Bld 88 70 - 99 mg/dL   BUN 12 6 - 23 mg/dL   Creatinine, Ser 0.77 0.40 - 1.20 mg/dL   Total Bilirubin 0.6 0.2 - 1.2 mg/dL   Alkaline Phosphatase 62 39 - 117 U/L   AST 17 0 - 37 U/L   ALT 16 0 - 35 U/L   Total Protein 7.2 6.0 - 8.3 g/dL   Albumin 4.8 3.5 - 5.2 g/dL   Calcium 9.9 8.4 - 10.5 mg/dL   GFR 76.99 >60.00 mL/min  Hemoglobin A1c     Status: None   Collection Time: 12/08/18  1:44 PM  Result Value Ref Range   Hgb A1c MFr Bld 5.4 4.6 - 6.5 %    Comment: Glycemic Control Guidelines for People with Diabetes:Non Diabetic:  <6%Goal of Therapy: <7%Additional Action Suggested:  >8%   Lipid panel     Status: Abnormal   Collection Time: 12/08/18  1:44 PM  Result Value Ref Range   Cholesterol 290 (H) 0 - 200 mg/dL    Comment: ATP III Classification       Desirable:  < 200 mg/dL               Borderline High:   200 - 239 mg/dL          High:  > = 240 mg/dL   Triglycerides 312.0 (H) 0.0 - 149.0 mg/dL    Comment: Normal:  <150 mg/dLBorderline High:  150 - 199 mg/dL   HDL 45.90 >39.00 mg/dL   VLDL 62.4 (H) 0.0 - 40.0 mg/dL   Total CHOL/HDL Ratio 6     Comment:                Men          Women1/2 Average Risk     3.4          3.3Average Risk          5.0          4.42X Average Risk  9.6          7.13X Average Risk          15.0          11.0                       NonHDL 243.62     Comment: NOTE:  Non-HDL goal should be 30 mg/dL higher than patient's LDL goal (i.e. LDL goal of < 70 mg/dL, would have non-HDL goal of < 100 mg/dL)  TSH     Status: None   Collection Time: 12/08/18  1:44 PM  Result Value Ref Range   TSH 1.37 0.35 - 4.50 uIU/mL  Vitamin D (25 hydroxy)     Status: Abnormal   Collection Time: 12/08/18  1:44 PM  Result Value Ref Range   VITD 25.59 (L) 30.00 - 100.00 ng/mL  LDL cholesterol, direct     Status: None   Collection Time: 12/08/18  1:44 PM  Result Value Ref Range   Direct LDL 184.0 mg/dL    Comment: Optimal:  <100 mg/dLNear or Above Optimal:  100-129 mg/dLBorderline High:  130-159 mg/dLHigh:  160-189 mg/dLVery High:  >190 mg/dL    Mm 3d Screen Breast Bilateral  Result Date: 06/13/2018 CLINICAL DATA:  Screening. EXAM: DIGITAL SCREENING BILATERAL MAMMOGRAM WITH TOMO AND CAD COMPARISON:  Previous exam(s). ACR Breast Density Category b: There are scattered areas of fibroglandular density. FINDINGS: There are no findings suspicious for malignancy. Images were processed with CAD. IMPRESSION: No mammographic evidence of malignancy. A result letter of this screening mammogram will be mailed directly to the patient. RECOMMENDATION: Screening mammogram in one year. (Code:SM-B-01Y) BI-RADS CATEGORY  1: Negative. Electronically Signed   By: Ammie Ferrier M.D.   On: 06/13/2018 13:01    ROS: 14 pt review of systems performed and negative (unless mentioned in an HPI)  Objective:  BP 111/73 (BP Location: Left Arm, Patient Position: Sitting, Cuff Size: Normal)   Pulse 73   Temp 98.3 F (36.8 C) (Temporal)   Resp 17   Ht 5\' 3"  (1.6 m)   Wt 158 lb 8 oz (71.9 kg)   SpO2 98%   BMI 28.08 kg/m  Gen: Afebrile. No acute distress. Nontoxic in appearance, well-developed, well-nourished,   HENT: AT. [place]. Bilateral TM visualized and normal in appearance, normal external auditory canal. MMM, no oral lesions, adequate dentition. Bilateral nares within normal limits. Throat without  erythema, ulcerations or exudates.  No cough on exam, no hoarseness on exam. Eyes:Pupils Equal Round Reactive to light, Extraocular movements intact,  Conjunctiva without redness, discharge or icterus. Neck/lymp/endocrine: Supple, no lymphadenopathy, no thyromegaly CV: RRR no murmur, no edema, +2/4 P posterior tibialis pulses.  No carotid bruits. No JVD. Chest: CTAB, no wheeze, rhonchi or crackles.  Normal respiratory effort.  Good air movement. Abd: Soft.  Flat. NTND. BS present.  No masses palpated. No hepatosplenomegaly. No rebound tenderness or guarding. Skin: No rashes, purpura or petechiae. Warm and well-perfused. Skin intact. Neuro/Msk:  Normal gait. PERLA. EOMi. Alert. Oriented x3.  Cranial nerves II through XII intact. Muscle strength 5/5 upper/lower extremity. DTRs equal bilaterally. Psych: Normal affect, dress and demeanor. Normal speech. Normal thought content and judgment. Breasts: breasts appear normal, symmetrical, no tenderness on exam, no suspicious masses, no skin or nipple changes or axillary nodes. GYN:  External genitalia within normal limits, normal hair distribution, no lesions. Urethral meatus normal, no lesions. Vaginal mucosa pink, moist, normal rugae, no lesions. No cystocele  or rectocele. cervix without lesions, no discharge. Bimanual exam revealed normal uterus.  No bladder/suprapubic fullness, masses or tenderness. No cervical motion tenderness. No adnexal fullness. Anus and  perineum within normal limits, no lesions.  No exam data present  Assessment/plan: Sarah Velazquez is a 58 y.o. female present for CPE Encounter for preventive health examination Patient was encouraged to exercise greater than 150 minutes a week. Patient was encouraged to choose a diet filled with fresh fruits and vegetables, and lean meats. AVS provided to patient today for education/recommendation on gender specific health and safety maintenance. Colonoscopy: Due 06/10/2022, for 5 yr f/u.  Mammogram: Due 06/14/2019 normal. Breast center [place]>>ordered Cervical cancer screening: collected today with co-test Immunizations: Td UTD 2013, declined influenza, declined shingrix for now but may schedule a nurse visit after her surgery to start series.  She is aware this is a 2 part series and the second injection needs to be at least 2 months from first injection, no longer than 6 months apart. Infectious disease screening: HIV and hep completed DEXA: due for 10 yr  rpt 2022 (last normal) Hyperlipidemia, unspecified hyperlipidemia type/overweight Has declined statins in the past. Has tried vegetarian diets to aide as well.  -Mediterranean diet and exercise encouraged - Comprehensive metabolic panel - Lipid panel - TSH Vitamin D deficiency - Vitamin D (25 hydroxy) Encounter for cervical Pap smear with pelvic exam Completed today with co-test. - Cytology - PAP Screening for deficiency anemia - CBC with Differential/Platelet Elevated hemoglobin A1c Mediterranean diet and exercise recommended - Hemoglobin A1c Osteoarthritis of left knee/form completion-surgical clearance: -Patient is scheduled to have a total knee replacement of her left knee.  Forms completed for surgical clearance today. Form and lab results will be faxed to  Orthopedic once resulted.  - Pt made aware to DC or avoid use of NSAIDS and OTC supplements not approved by her ortho for 14 days prior to surgery and she may resume them 3 days  after.   Return in about 1 year (around 12/08/2019) for CPE (30 min).   Orders Placed This Encounter  Procedures  . MM 3D SCREEN BREAST BILATERAL  . CBC with Differential/Platelet  . Comprehensive metabolic panel  . Hemoglobin A1c  . Lipid panel  . TSH  . Vitamin D (25 hydroxy)  . LDL cholesterol, direct     Electronically signed by: Howard Pouch, DO [place]

## 2018-12-08 NOTE — Telephone Encounter (Signed)
Please inform patient the following information: Her labs are all normal with the exception of her cholesterol- which is extremely high.  - LDL (BAd) 184 (goal <130- closer to 100 better) - trig 312- goal < 150 - total cholesterol 290 (goal <200)  And   - her Vit d is lower than desired at 25 (goal > 30-50)>> start 1000u of vit d OTC daily with food.    She has not wanted medications in the past- her cholesterol continues to climb and she is at a much higher risk now for heart attack or stroke with her levels. I do recommend she reconsider and start a statin.    - if agreeable- will call in one for her to start with followup in November for retesting. (follow up only if start the statin)   -If she declines can refer to cardiology to discuss if she would rather.

## 2018-12-08 NOTE — Telephone Encounter (Signed)
error 

## 2018-12-08 NOTE — Telephone Encounter (Signed)
Pt would like to know what statin so she can research it before saying yes to a statin. Pt does believe her numbers are up due to going from Vegan to vegetarian and adding eggs and cheese to her diet.  Pt was given all results and verbalized understanding   Please advise

## 2018-12-08 NOTE — Telephone Encounter (Signed)
Please advise patient it would be Lipitor or Crestor.  Although I believe seeking knowledge on the medications one is taking very  important, I would stress this comes recommended by the American Heart Association.   - There is plenty of good and bad press on Google for anything researched, and I would advise her to obtain her information from trained professionals.  -  If she does not agree with the above opinion we can always refer her to cardiology to discuss benefits versus risks.

## 2018-12-09 ENCOUNTER — Telehealth: Payer: Self-pay | Admitting: Family Medicine

## 2018-12-09 DIAGNOSIS — M1712 Unilateral primary osteoarthritis, left knee: Secondary | ICD-10-CM | POA: Diagnosis not present

## 2018-12-09 LAB — CYTOLOGY - PAP
Diagnosis: NEGATIVE
HPV: NOT DETECTED

## 2018-12-09 MED ORDER — ROSUVASTATIN CALCIUM 20 MG PO TABS: 20.0000 mg | ORAL_TABLET | Freq: Every day | ORAL | 3 refills | Status: DC

## 2018-12-09 NOTE — Telephone Encounter (Signed)
Called patient and she verbalized understanding. Scheduled patient for 37mo f/u with labs

## 2018-12-09 NOTE — Telephone Encounter (Signed)
crestor called in. Please make sure she has f/u in 3 mos for her hyperlipidemia and crestor start. We will be collecting labs that day as well- please fast 8-9 hours.

## 2018-12-09 NOTE — Telephone Encounter (Signed)
Called patient and lvm to call me back about medications and recommendations

## 2018-12-09 NOTE — Telephone Encounter (Signed)
Patient called me back about medications. She verbalized understanding. Stated that she will take the medication with the least side effects; Crestor or Lipitor. She is going to become Vegan again and wanted you to know. She also asked if she should start taking red yeast and fish oil again? Please advise.

## 2018-12-09 NOTE — Telephone Encounter (Signed)
Patient dropped off surgical clearance form for completion.

## 2018-12-09 NOTE — Addendum Note (Signed)
Addended by: Howard Pouch A on: 12/09/2018 01:05 PM   Modules accepted: Orders

## 2018-12-10 ENCOUNTER — Encounter: Payer: Self-pay | Admitting: Family Medicine

## 2018-12-10 DIAGNOSIS — Z79899 Other long term (current) drug therapy: Secondary | ICD-10-CM | POA: Insufficient documentation

## 2018-12-10 NOTE — Telephone Encounter (Signed)
Please inform patient her surgical clearance form has been completed. Please fax form and most recent lab results to her orthopedic. Form charge on paper.

## 2018-12-10 NOTE — Telephone Encounter (Signed)
Form was faxed to MD. Copy made and placed to be scanned. Form sent up front for form charges. My chart message sent to patient.

## 2018-12-15 DIAGNOSIS — F341 Dysthymic disorder: Secondary | ICD-10-CM | POA: Diagnosis not present

## 2018-12-22 DIAGNOSIS — F341 Dysthymic disorder: Secondary | ICD-10-CM | POA: Diagnosis not present

## 2018-12-22 DIAGNOSIS — F411 Generalized anxiety disorder: Secondary | ICD-10-CM | POA: Diagnosis not present

## 2018-12-23 DIAGNOSIS — M1712 Unilateral primary osteoarthritis, left knee: Secondary | ICD-10-CM | POA: Diagnosis not present

## 2019-01-27 DIAGNOSIS — Z96652 Presence of left artificial knee joint: Secondary | ICD-10-CM | POA: Diagnosis not present

## 2019-01-27 DIAGNOSIS — Z471 Aftercare following joint replacement surgery: Secondary | ICD-10-CM | POA: Diagnosis not present

## 2019-01-30 DIAGNOSIS — M5442 Lumbago with sciatica, left side: Secondary | ICD-10-CM | POA: Diagnosis not present

## 2019-02-02 DIAGNOSIS — M50322 Other cervical disc degeneration at C5-C6 level: Secondary | ICD-10-CM | POA: Diagnosis not present

## 2019-02-02 DIAGNOSIS — M5136 Other intervertebral disc degeneration, lumbar region: Secondary | ICD-10-CM | POA: Diagnosis not present

## 2019-02-02 DIAGNOSIS — M9901 Segmental and somatic dysfunction of cervical region: Secondary | ICD-10-CM | POA: Diagnosis not present

## 2019-02-02 DIAGNOSIS — M9903 Segmental and somatic dysfunction of lumbar region: Secondary | ICD-10-CM | POA: Diagnosis not present

## 2019-02-02 DIAGNOSIS — M5442 Lumbago with sciatica, left side: Secondary | ICD-10-CM | POA: Diagnosis not present

## 2019-02-02 DIAGNOSIS — M5116 Intervertebral disc disorders with radiculopathy, lumbar region: Secondary | ICD-10-CM | POA: Diagnosis not present

## 2019-02-02 DIAGNOSIS — M9902 Segmental and somatic dysfunction of thoracic region: Secondary | ICD-10-CM | POA: Diagnosis not present

## 2019-02-02 DIAGNOSIS — M9904 Segmental and somatic dysfunction of sacral region: Secondary | ICD-10-CM | POA: Diagnosis not present

## 2019-02-04 DIAGNOSIS — M9901 Segmental and somatic dysfunction of cervical region: Secondary | ICD-10-CM | POA: Diagnosis not present

## 2019-02-04 DIAGNOSIS — M9902 Segmental and somatic dysfunction of thoracic region: Secondary | ICD-10-CM | POA: Diagnosis not present

## 2019-02-04 DIAGNOSIS — M9904 Segmental and somatic dysfunction of sacral region: Secondary | ICD-10-CM | POA: Diagnosis not present

## 2019-02-04 DIAGNOSIS — M5136 Other intervertebral disc degeneration, lumbar region: Secondary | ICD-10-CM | POA: Diagnosis not present

## 2019-02-04 DIAGNOSIS — M50322 Other cervical disc degeneration at C5-C6 level: Secondary | ICD-10-CM | POA: Diagnosis not present

## 2019-02-04 DIAGNOSIS — M5116 Intervertebral disc disorders with radiculopathy, lumbar region: Secondary | ICD-10-CM | POA: Diagnosis not present

## 2019-02-04 DIAGNOSIS — M9903 Segmental and somatic dysfunction of lumbar region: Secondary | ICD-10-CM | POA: Diagnosis not present

## 2019-02-06 DIAGNOSIS — M5442 Lumbago with sciatica, left side: Secondary | ICD-10-CM | POA: Diagnosis not present

## 2019-02-09 DIAGNOSIS — M9901 Segmental and somatic dysfunction of cervical region: Secondary | ICD-10-CM | POA: Diagnosis not present

## 2019-02-09 DIAGNOSIS — M50322 Other cervical disc degeneration at C5-C6 level: Secondary | ICD-10-CM | POA: Diagnosis not present

## 2019-02-09 DIAGNOSIS — M5442 Lumbago with sciatica, left side: Secondary | ICD-10-CM | POA: Diagnosis not present

## 2019-02-09 DIAGNOSIS — M5136 Other intervertebral disc degeneration, lumbar region: Secondary | ICD-10-CM | POA: Diagnosis not present

## 2019-02-09 DIAGNOSIS — M9903 Segmental and somatic dysfunction of lumbar region: Secondary | ICD-10-CM | POA: Diagnosis not present

## 2019-02-11 DIAGNOSIS — M9903 Segmental and somatic dysfunction of lumbar region: Secondary | ICD-10-CM | POA: Diagnosis not present

## 2019-02-11 DIAGNOSIS — M50322 Other cervical disc degeneration at C5-C6 level: Secondary | ICD-10-CM | POA: Diagnosis not present

## 2019-02-11 DIAGNOSIS — M5136 Other intervertebral disc degeneration, lumbar region: Secondary | ICD-10-CM | POA: Diagnosis not present

## 2019-02-11 DIAGNOSIS — M9901 Segmental and somatic dysfunction of cervical region: Secondary | ICD-10-CM | POA: Diagnosis not present

## 2019-02-13 DIAGNOSIS — M5442 Lumbago with sciatica, left side: Secondary | ICD-10-CM | POA: Diagnosis not present

## 2019-02-16 DIAGNOSIS — M9901 Segmental and somatic dysfunction of cervical region: Secondary | ICD-10-CM | POA: Diagnosis not present

## 2019-02-16 DIAGNOSIS — M9903 Segmental and somatic dysfunction of lumbar region: Secondary | ICD-10-CM | POA: Diagnosis not present

## 2019-02-16 DIAGNOSIS — M50322 Other cervical disc degeneration at C5-C6 level: Secondary | ICD-10-CM | POA: Diagnosis not present

## 2019-02-16 DIAGNOSIS — M5442 Lumbago with sciatica, left side: Secondary | ICD-10-CM | POA: Diagnosis not present

## 2019-02-16 DIAGNOSIS — M5136 Other intervertebral disc degeneration, lumbar region: Secondary | ICD-10-CM | POA: Diagnosis not present

## 2019-02-18 ENCOUNTER — Ambulatory Visit (INDEPENDENT_AMBULATORY_CARE_PROVIDER_SITE_OTHER): Payer: BC Managed Care – PPO

## 2019-02-18 ENCOUNTER — Other Ambulatory Visit: Payer: Self-pay

## 2019-02-18 DIAGNOSIS — Z23 Encounter for immunization: Secondary | ICD-10-CM

## 2019-02-18 DIAGNOSIS — M9903 Segmental and somatic dysfunction of lumbar region: Secondary | ICD-10-CM | POA: Diagnosis not present

## 2019-02-18 DIAGNOSIS — M50322 Other cervical disc degeneration at C5-C6 level: Secondary | ICD-10-CM | POA: Diagnosis not present

## 2019-02-18 DIAGNOSIS — M9901 Segmental and somatic dysfunction of cervical region: Secondary | ICD-10-CM | POA: Diagnosis not present

## 2019-02-18 DIAGNOSIS — M5136 Other intervertebral disc degeneration, lumbar region: Secondary | ICD-10-CM | POA: Diagnosis not present

## 2019-02-20 DIAGNOSIS — M5442 Lumbago with sciatica, left side: Secondary | ICD-10-CM | POA: Diagnosis not present

## 2019-02-23 DIAGNOSIS — M9901 Segmental and somatic dysfunction of cervical region: Secondary | ICD-10-CM | POA: Diagnosis not present

## 2019-02-23 DIAGNOSIS — M9903 Segmental and somatic dysfunction of lumbar region: Secondary | ICD-10-CM | POA: Diagnosis not present

## 2019-02-23 DIAGNOSIS — M5442 Lumbago with sciatica, left side: Secondary | ICD-10-CM | POA: Diagnosis not present

## 2019-02-23 DIAGNOSIS — M50322 Other cervical disc degeneration at C5-C6 level: Secondary | ICD-10-CM | POA: Diagnosis not present

## 2019-02-23 DIAGNOSIS — M5136 Other intervertebral disc degeneration, lumbar region: Secondary | ICD-10-CM | POA: Diagnosis not present

## 2019-02-25 DIAGNOSIS — M9901 Segmental and somatic dysfunction of cervical region: Secondary | ICD-10-CM | POA: Diagnosis not present

## 2019-02-25 DIAGNOSIS — M9903 Segmental and somatic dysfunction of lumbar region: Secondary | ICD-10-CM | POA: Diagnosis not present

## 2019-02-25 DIAGNOSIS — M50322 Other cervical disc degeneration at C5-C6 level: Secondary | ICD-10-CM | POA: Diagnosis not present

## 2019-02-25 DIAGNOSIS — M5136 Other intervertebral disc degeneration, lumbar region: Secondary | ICD-10-CM | POA: Diagnosis not present

## 2019-02-27 ENCOUNTER — Encounter: Payer: Self-pay | Admitting: Family Medicine

## 2019-02-27 ENCOUNTER — Telehealth: Payer: Self-pay | Admitting: Family Medicine

## 2019-02-27 ENCOUNTER — Ambulatory Visit (INDEPENDENT_AMBULATORY_CARE_PROVIDER_SITE_OTHER): Payer: BC Managed Care – PPO | Admitting: Family Medicine

## 2019-02-27 ENCOUNTER — Other Ambulatory Visit: Payer: Self-pay

## 2019-02-27 VITALS — BP 129/79 | HR 75 | Temp 98.4°F | Resp 17 | Ht 63.0 in | Wt 150.1 lb

## 2019-02-27 DIAGNOSIS — E663 Overweight: Secondary | ICD-10-CM | POA: Diagnosis not present

## 2019-02-27 DIAGNOSIS — E785 Hyperlipidemia, unspecified: Secondary | ICD-10-CM

## 2019-02-27 DIAGNOSIS — Z79899 Other long term (current) drug therapy: Secondary | ICD-10-CM | POA: Diagnosis not present

## 2019-02-27 DIAGNOSIS — E559 Vitamin D deficiency, unspecified: Secondary | ICD-10-CM

## 2019-02-27 DIAGNOSIS — M5442 Lumbago with sciatica, left side: Secondary | ICD-10-CM | POA: Diagnosis not present

## 2019-02-27 LAB — HEPATIC FUNCTION PANEL
ALT: 33 U/L (ref 0–35)
AST: 26 U/L (ref 0–37)
Albumin: 4.7 g/dL (ref 3.5–5.2)
Alkaline Phosphatase: 62 U/L (ref 39–117)
Bilirubin, Direct: 0.1 mg/dL (ref 0.0–0.3)
Total Bilirubin: 0.5 mg/dL (ref 0.2–1.2)
Total Protein: 6.8 g/dL (ref 6.0–8.3)

## 2019-02-27 LAB — LIPID PANEL
Cholesterol: 129 mg/dL (ref 0–200)
HDL: 41.9 mg/dL (ref >39.00)
LDL Cholesterol: 56 mg/dL (ref 0–99)
NonHDL: 87.3
Total CHOL/HDL Ratio: 3
Triglycerides: 155 mg/dL — ABNORMAL HIGH (ref 0.0–149.0)
VLDL: 31 mg/dL (ref 0.0–40.0)

## 2019-02-27 LAB — VITAMIN D 25 HYDROXY (VIT D DEFICIENCY, FRACTURES): VITD: 26.2 ng/mL — ABNORMAL LOW (ref 30.00–100.00)

## 2019-02-27 MED ORDER — VITAMIN D (ERGOCALCIFEROL) 1.25 MG (50000 UNIT) PO CAPS: 50000.0000 [IU] | ORAL_CAPSULE | ORAL | 0 refills | Status: DC

## 2019-02-27 NOTE — Telephone Encounter (Signed)
Please inform patient the following information: Her vitamin D is still low at 26. I have called in the high dose once a week pill and she is to continue her daily vit dafter finishing the 12 weeks of high dose.  Her cholesterol panel is amazing.  Her total cholesterol is down to 129, from 290 before.  Her bad cholesterol/LDL is 56, down from 184 before.  Her triglycerides are down to 155, from 312 before. The Crestor is providing her cardiovascular protection and she should remain on this medication.  However her diet and exercise changes have made the majority of the difference I believe in her cholesterol panel.  Therefore I would suggest if she desires she can take half a tab of the Crestor daily.  If she would like to stay on the full dose that is okay to.

## 2019-02-27 NOTE — Progress Notes (Signed)
Patient ID: Sarah Velazquez, female  DOB: Sep 02, 1960, 58 y.o.   MRN: [id number] Patient Care Team    Relationship Specialty Notifications Start End  Ma Hillock, DO PCP - General Family Medicine  08/03/15   Pyrtle, Lajuan Lines, MD Consulting Physician Gastroenterology  08/21/17     Chief Complaint  Patient presents with   Hyperlipidemia    Pt is doing well with no side effects from medications, crestor.     Subjective:  Sarah Velazquez is a 58 y.o.  Female  present for [facility] f/o  Hyperlipidemia/overweight: She started the crestor 3 months ago and tolerating.  She denies any side effects notable to use.  She has been able to restart her exercise program now that her orthopedic issues have improved.  She is also returned to her vegan diet.  Vitamin D deficiency: Patient reports she has started the 1000 units of vitamin D after our last appointment.  Depression screen [facility] 2/9 12/08/2018 10/31/2017 08/21/2017 08/20/2016  Decreased Interest 1 0 0 0  Down, Depressed, Hopeless 1 0 0 0  PHQ - 2 Score 2 0 0 0  Altered sleeping 0 - - -  Tired, decreased energy 0 - - -  Change in appetite 0 - - -  Feeling bad or failure about yourself  0 - - -  Trouble concentrating 0 - - -  Moving slowly or fidgety/restless 0 - - -  Suicidal thoughts 0 - - -  PHQ-9 Score 2 - - -  Difficult doing work/chores Somewhat difficult - - -   No flowsheet data found.   Immunization History  Administered Date(s) Administered   Influenza Split 02/12/2012   Influenza Whole 02/12/2011   Influenza,inj,Quad PF,6+ Mos 02/18/2019   Tdap 09/13/2011   Past Medical History:  Diagnosis Date   Allergy    seasonal, dogs, cats, horses, hay   Arthritis    OA knees    Asthma    Bell's palsy in high school   right   Cervical cancer screening 04/08/2012   Chicken pox as a child   Heart murmur    as child   History of neutropenia    age [age], received gamma globulin monthly; with multiple hosp admisson for illness  (per pt)   Hyperglycemia    Hyperlipidemia    Hyperplastic colon polyp 04/09/2012   Lipoma    growing slowly since 20s   Menopause 2007   Migraines    Mumps as a child   Shingles    right cheek   Thrombocytosis ([place]) 04/09/2012   Vitamin D deficiency    Allergies  Allergen Reactions   Sulfa Antibiotics    Past Surgical History:  Procedure Laterality Date   CESAREAN SECTION  05-10-87   COLONOSCOPY     cyst removed     twice on back and 1 on foot   DILATION AND CURETTAGE OF UTERUS  1989   incomplete miscarriage with second pregnancy and again post menopausal   INJECTION KNEE Bilateral 09/29/15   Gel   POLYPECTOMY     TONSILLECTOMY  as a child   Family History  Problem Relation Age of Onset   Stroke Mother        X 3   Heart attack Mother    Diabetes Mother        type 2   Anxiety disorder Mother    Hypertension Mother    Hyperlipidemia Mother    Heart disease Mother  Cancer Mother        breast, skin   Colon polyps Mother    Breast cancer Mother    Stroke Father        several   Heart attack Father        several   Kidney disease Father        on dialysis   Hypertension Father    Hyperlipidemia Father    Cancer Father        mouth X 2/ chewed tobacco, and skin   Anxiety disorder Father    Colon polyps Father    Squamous cell carcinoma Father    Hypertension Brother    Hyperlipidemia Brother    Depression Brother    Cancer Maternal Grandmother        breast   Diabetes Maternal Grandmother        type 2   Obesity Maternal Grandmother    Breast cancer Maternal Grandmother    Cancer Paternal Grandmother        lung/ didn't smoke   Heart attack Paternal Grandfather    Lung cancer Paternal Grandfather    Colon cancer Neg Hx    Rectal cancer Neg Hx    Stomach cancer Neg Hx    Social History   Social History Narrative   Married to [place]. Has 3 children Jaci Standard, [place], [place])   Bachelors of  science, homemaker.   Drinks caffeinated beverages, uses herbal remedies, takes a daily vitamin.   Wears her seatbelt, bicycle helmet and exercise is greater than 3 times a week.   Patient eats vegetarian meals 5 days a week, minimal meat product.   Smoke detector in the home, firearms in the home.   Feels safe in her relationships.    Allergies as of 02/27/2019      Reactions   Sulfa Antibiotics       Medication List       Accurate as of February 27, 2019 12:15 PM. If you have any questions, ask your nurse or doctor.        STOP taking these medications   B-1 100 MG Tabs Stopped by: Howard Pouch, DO   CINNAMON PO Stopped by: Howard Pouch, DO   OMEGA 3-6-9 PO Stopped by: Howard Pouch, DO   POTASSIMIN PO Stopped by: Howard Pouch, DO   TART CHERRY ADVANCED PO Stopped by: Howard Pouch, DO   TURMERIC PO Stopped by: Howard Pouch, DO     TAKE these medications   ALEVE PO Take 1 tablet by mouth as needed.   rosuvastatin 20 MG tablet Commonly known as: Crestor Take 1 tablet (20 mg total) by mouth daily.   TYLENOL ARTHRITIS PAIN PO Take 1 tablet by mouth as needed.   Vitamin B-12 500 MCG Subl Place under the tongue. Spray daily   Vitamin D (Cholecalciferol) 25 MCG (1000 UT) Tabs Take 1 tablet by mouth daily.       All past medical history, surgical history, allergies, family history, immunizations andmedications were updated in the EMR today and reviewed under the history and medication portions of their EMR.     Recent Results (from the past 2160 hour(s))  Cytology - PAP     Status: None   Collection Time: 12/08/18 12:00 AM  Result Value Ref Range   Adequacy      Satisfactory for evaluation  endocervical/transformation zone component PRESENT.   Diagnosis      NEGATIVE FOR INTRAEPITHELIAL LESIONS OR MALIGNANCY.   HPV NOT Detected  Comment: Normal Reference Range - NOT Detected   Material Submitted CervicoVaginal Pap [ThinPrep Imaged]   HM PAP SMEAR      Status: None   Collection Time: 12/08/18 12:00 AM  Result Value Ref Range   HM Pap smear see report   CBC with Differential/Platelet     Status: None   Collection Time: 12/08/18  1:44 PM  Result Value Ref Range   WBC 6.6 4.0 - 10.5 K/uL   RBC 4.60 3.87 - 5.11 Mil/uL   Hemoglobin 13.7 12.0 - 15.0 g/dL   HCT 40.4 36.0 - 46.0 %   MCV 87.7 78.0 - 100.0 fl   MCHC 33.9 30.0 - 36.0 g/dL   RDW 13.1 11.5 - 15.5 %   Platelets 340.0 150.0 - 400.0 K/uL   Neutrophils Relative % 54.8 43.0 - 77.0 %   Lymphocytes Relative 36.3 12.0 - 46.0 %   Monocytes Relative 7.4 3.0 - 12.0 %   Eosinophils Relative 0.4 0.0 - 5.0 %   Basophils Relative 1.1 0.0 - 3.0 %   Neutro Abs 3.6 1.4 - 7.7 K/uL   Lymphs Abs 2.4 0.7 - 4.0 K/uL   Monocytes Absolute 0.5 0.1 - 1.0 K/uL   Eosinophils Absolute 0.0 0.0 - 0.7 K/uL   Basophils Absolute 0.1 0.0 - 0.1 K/uL  Comprehensive metabolic panel     Status: None   Collection Time: 12/08/18  1:44 PM  Result Value Ref Range   Sodium 136 135 - 145 mEq/L   Potassium 4.2 3.5 - 5.1 mEq/L   Chloride 102 96 - 112 mEq/L   CO2 23 19 - 32 mEq/L   Glucose, Bld 88 70 - 99 mg/dL   BUN 12 6 - 23 mg/dL   Creatinine, Ser 0.77 0.40 - 1.20 mg/dL   Total Bilirubin 0.6 0.2 - 1.2 mg/dL   Alkaline Phosphatase 62 39 - 117 U/L   AST 17 0 - 37 U/L   ALT 16 0 - 35 U/L   Total Protein 7.2 6.0 - 8.3 g/dL   Albumin 4.8 3.5 - 5.2 g/dL   Calcium 9.9 8.4 - 10.5 mg/dL   GFR 76.99 >60.00 mL/min  Hemoglobin A1c     Status: None   Collection Time: 12/08/18  1:44 PM  Result Value Ref Range   Hgb A1c MFr Bld 5.4 4.6 - 6.5 %    Comment: Glycemic Control Guidelines for People with Diabetes:Non Diabetic:  <6%Goal of Therapy: <7%Additional Action Suggested:  >8%   Lipid panel     Status: Abnormal   Collection Time: 12/08/18  1:44 PM  Result Value Ref Range   Cholesterol 290 (H) 0 - 200 mg/dL    Comment: ATP III Classification       Desirable:  < 200 mg/dL               Borderline High:  200 - 239 mg/dL           High:  > = 240 mg/dL   Triglycerides 312.0 (H) 0.0 - 149.0 mg/dL    Comment: Normal:  <150 mg/dLBorderline High:  150 - 199 mg/dL   HDL 45.90 >39.00 mg/dL   VLDL 62.4 (H) 0.0 - 40.0 mg/dL   Total CHOL/HDL Ratio 6     Comment:                Men          Women1/2 Average Risk     3.4  3.3Average Risk          5.0          4.42X Average Risk          9.6          7.13X Average Risk          15.0          11.0                       NonHDL 243.62     Comment: NOTE:  Non-HDL goal should be 30 mg/dL higher than patient's LDL goal (i.e. LDL goal of < 70 mg/dL, would have non-HDL goal of < 100 mg/dL)  TSH     Status: None   Collection Time: 12/08/18  1:44 PM  Result Value Ref Range   TSH 1.37 0.35 - 4.50 uIU/mL  Vitamin D (25 hydroxy)     Status: Abnormal   Collection Time: 12/08/18  1:44 PM  Result Value Ref Range   VITD 25.59 (L) 30.00 - 100.00 ng/mL  LDL cholesterol, direct     Status: None   Collection Time: 12/08/18  1:44 PM  Result Value Ref Range   Direct LDL 184.0 mg/dL    Comment: Optimal:  <100 mg/dLNear or Above Optimal:  100-129 mg/dLBorderline High:  130-159 mg/dLHigh:  160-189 mg/dLVery High:  >190 mg/dL    Mm 3d Screen Breast Bilateral  Result Date: 06/13/2018 CLINICAL DATA:  Screening. EXAM: DIGITAL SCREENING BILATERAL MAMMOGRAM WITH TOMO AND CAD COMPARISON:  Previous exam(s). ACR Breast Density Category b: There are scattered areas of fibroglandular density. FINDINGS: There are no findings suspicious for malignancy. Images were processed with CAD. IMPRESSION: No mammographic evidence of malignancy. A result letter of this screening mammogram will be mailed directly to the patient. RECOMMENDATION: Screening mammogram in one year. (Code:SM-B-01Y) BI-RADS CATEGORY  1: Negative. Electronically Signed   By: Ammie Ferrier M.D.   On: 06/13/2018 13:01    ROS: 14 pt review of systems performed and negative (unless mentioned in an HPI)  Objective: BP 129/79 (BP  Location: Right Arm, Patient Position: Sitting, Cuff Size: Normal)    Pulse 75    Temp 98.4 F (36.9 C) (Temporal)    Resp 17    Ht 5\' 3"  (1.6 m)    Wt 150 lb 2 oz (68.1 kg)    SpO2 99%    BMI 26.59 kg/m  Gen: Afebrile. No acute distress.  HENT: AT. [place].  Eyes:Pupils Equal Round Reactive to light, Extraocular movements intact,  Conjunctiva without redness, discharge or icterus. CV: RRR, no edema, +2/4 P posterior tibialis pulses Chest: CTAB, no wheeze or crackles Abd: Soft. NTND. BS present. no Masses palpated.  Skin: no rashes, purpura or petechiae.  Neuro:  Normal gait. PERLA. EOMi. Alert. Oriented.   No exam data present  Assessment/plan: Sarah Velazquez is a 59 y.o. female present for [facility] Hyperlipidemia/overweight/Statin therapy -Patient has lost 8 pounds since last appointment.  Congratulated her.  She is also went back to her vegan diet and able to exercise now that her orthopedic issues are over. -Repeat lipids and liver function test today. -Continue statin.  Dose will be adjusted if need be after results are received. -follow yearly with her physical.  Vitamin D deficiency Low vitamin D 3 months ago, patient is now supplementing with vitamin D 1000 units daily.  Recheck levels today. - Vitamin D (25 hydroxy)    No follow-ups on file.  Orders Placed This Encounter  Procedures   Hepatic function panel   Lipid panel   Vitamin D (25 hydroxy)     Electronically signed by: Howard Pouch, DO [place]

## 2019-02-27 NOTE — Patient Instructions (Signed)
Great to see you today.  We will call you with results once received.    High Cholesterol  High cholesterol is a condition in which the blood has high levels of a white, waxy, fat-like substance (cholesterol). The human body needs small amounts of cholesterol. The liver makes all the cholesterol that the body needs. Extra (excess) cholesterol comes from the food that we eat. Cholesterol is carried from the liver by the blood through the blood vessels. If you have high cholesterol, deposits (plaques) may build up on the walls of your blood vessels (arteries). Plaques make the arteries narrower and stiffer. Cholesterol plaques increase your risk for heart attack and stroke. Work with your health care provider to keep your cholesterol levels in a healthy range. What increases the risk? This condition is more likely to develop in people who:  Eat foods that are high in animal fat (saturated fat) or cholesterol.  Are overweight.  Are not getting enough exercise.  Have a family history of high cholesterol. What are the signs or symptoms? There are no symptoms of this condition. How is this diagnosed? This condition may be diagnosed from the results of a blood test.  If you are older than age 37, your health care provider may check your cholesterol every 4-6 years.  You may be checked more often if you already have high cholesterol or other risk factors for heart disease. The blood test for cholesterol measures:  "Bad" cholesterol (LDL cholesterol). This is the main type of cholesterol that causes heart disease. The desired level for LDL is less than 100.  "Good" cholesterol (HDL cholesterol). This type helps to protect against heart disease by cleaning the arteries and carrying the LDL away. The desired level for HDL is 60 or higher.  Triglycerides. These are fats that the body can store or burn for energy. The desired number for triglycerides is lower than 150.  Total cholesterol. This  is a measure of the total amount of cholesterol in your blood, including LDL cholesterol, HDL cholesterol, and triglycerides. A healthy number is less than 200. How is this treated? This condition is treated with diet changes, lifestyle changes, and medicines. Diet changes  This may include eating more whole grains, fruits, vegetables, nuts, and fish.  This may also include cutting back on red meat and foods that have a lot of added sugar. Lifestyle changes  Changes may include getting at least 40 minutes of aerobic exercise 3 times a week. Aerobic exercises include walking, biking, and swimming. Aerobic exercise along with a healthy diet can help you maintain a healthy weight.  Changes may also include quitting smoking. Medicines  Medicines are usually given if diet and lifestyle changes have failed to reduce your cholesterol to healthy levels.  Your health care provider may prescribe a statin medicine. Statin medicines have been shown to reduce cholesterol, which can reduce the risk of heart disease. Follow these instructions at home: Eating and drinking If told by your health care provider:  Eat chicken (without skin), fish, veal, shellfish, ground [place] breast, and round or loin cuts of red meat.  Do not eat fried foods or fatty meats, such as hot dogs and salami.  Eat plenty of fruits, such as apples.  Eat plenty of vegetables, such as broccoli, potatoes, and carrots.  Eat beans, peas, and lentils.  Eat grains such as barley, rice, couscous, and bulgur wheat.  Eat pasta without cream sauces.  Use skim or nonfat milk, and eat low-fat or  nonfat yogurt and cheeses.  Do not eat or drink whole milk, cream, ice cream, egg yolks, or hard cheeses.  Do not eat stick margarine or tub margarines that contain trans fats (also called partially hydrogenated oils).  Do not eat saturated tropical oils, such as coconut oil and palm oil.  Do not eat cakes, cookies, crackers, or other  baked goods that contain trans fats.  General instructions  Exercise as directed by your health care provider. Increase your activity level with activities such as gardening, walking, and taking the stairs.  Take over-the-counter and prescription medicines only as told by your health care provider.  Do not use any products that contain nicotine or tobacco, such as cigarettes and e-cigarettes. If you need help quitting, ask your health care provider.  Keep all follow-up visits as told by your health care provider. This is important. Contact a health care provider if:  You are struggling to maintain a healthy diet or weight.  You need help to start on an exercise program.  You need help to stop smoking. Get help right away if:  You have chest pain.  You have trouble breathing. This information is not intended to replace advice given to you by your health care provider. Make sure you discuss any questions you have with your health care provider. Document Released: 04/30/2005 Document Revised: 05/03/2017 Document Reviewed: 10/29/2015 Elsevier Patient Education  2020 [employer].

## 2019-03-02 DIAGNOSIS — M5442 Lumbago with sciatica, left side: Secondary | ICD-10-CM | POA: Diagnosis not present

## 2019-03-02 DIAGNOSIS — M50322 Other cervical disc degeneration at C5-C6 level: Secondary | ICD-10-CM | POA: Diagnosis not present

## 2019-03-02 DIAGNOSIS — M9903 Segmental and somatic dysfunction of lumbar region: Secondary | ICD-10-CM | POA: Diagnosis not present

## 2019-03-02 DIAGNOSIS — M9901 Segmental and somatic dysfunction of cervical region: Secondary | ICD-10-CM | POA: Diagnosis not present

## 2019-03-02 DIAGNOSIS — M5136 Other intervertebral disc degeneration, lumbar region: Secondary | ICD-10-CM | POA: Diagnosis not present

## 2019-03-02 MED ORDER — ROSUVASTATIN CALCIUM 10 MG PO TABS: 10.0000 mg | ORAL_TABLET | Freq: Every day | ORAL | 2 refills | Status: DC

## 2019-03-02 NOTE — Addendum Note (Signed)
Addended by: Caroll Rancher L on: 03/02/2019 02:53 PM   Modules accepted: Orders

## 2019-03-02 NOTE — Telephone Encounter (Signed)
Pt verbalized understanding on lab results.

## 2019-03-02 NOTE — Telephone Encounter (Signed)
Pt would like new RX sent for 10mg  daily. Cancelled RX for 20mg  and all remaining refills at [facility]. Dr Raoul Pitch approved change,

## 2019-03-11 ENCOUNTER — Ambulatory Visit: Payer: BC Managed Care – PPO | Admitting: Family Medicine

## 2019-04-18 DIAGNOSIS — Z20828 Contact with and (suspected) exposure to other viral communicable diseases: Secondary | ICD-10-CM | POA: Diagnosis not present

## 2019-06-15 ENCOUNTER — Ambulatory Visit
Admission: RE | Admit: 2019-06-15 | Discharge: 2019-06-15 | Disposition: A | Discharge status: Home / Self Care | Payer: BC Managed Care – PPO | Source: Ambulatory Visit | Attending: Family Medicine | Admitting: Family Medicine

## 2019-06-15 ENCOUNTER — Other Ambulatory Visit: Payer: Self-pay

## 2019-06-15 DIAGNOSIS — Z1239 Encounter for other screening for malignant neoplasm of breast: Secondary | ICD-10-CM

## 2019-09-17 ENCOUNTER — Other Ambulatory Visit: Payer: Self-pay

## 2019-09-17 MED ORDER — ROSUVASTATIN CALCIUM 10 MG PO TABS: 10.0000 mg | ORAL_TABLET | Freq: Every day | ORAL | 0 refills | Status: DC

## 2019-10-02 DIAGNOSIS — R040 Epistaxis: Secondary | ICD-10-CM | POA: Diagnosis not present

## 2019-10-02 DIAGNOSIS — H10022 Other mucopurulent conjunctivitis, left eye: Secondary | ICD-10-CM | POA: Diagnosis not present

## 2019-11-25 ENCOUNTER — Other Ambulatory Visit: Payer: Self-pay | Admitting: Family Medicine

## 2019-12-31 DIAGNOSIS — M50122 Cervical disc disorder at C5-C6 level with radiculopathy: Secondary | ICD-10-CM | POA: Diagnosis not present

## 2019-12-31 DIAGNOSIS — M9905 Segmental and somatic dysfunction of pelvic region: Secondary | ICD-10-CM | POA: Diagnosis not present

## 2019-12-31 DIAGNOSIS — M9901 Segmental and somatic dysfunction of cervical region: Secondary | ICD-10-CM | POA: Diagnosis not present

## 2019-12-31 DIAGNOSIS — M9903 Segmental and somatic dysfunction of lumbar region: Secondary | ICD-10-CM | POA: Diagnosis not present

## 2020-01-04 DIAGNOSIS — M9905 Segmental and somatic dysfunction of pelvic region: Secondary | ICD-10-CM | POA: Diagnosis not present

## 2020-01-04 DIAGNOSIS — M50122 Cervical disc disorder at C5-C6 level with radiculopathy: Secondary | ICD-10-CM | POA: Diagnosis not present

## 2020-01-04 DIAGNOSIS — M9901 Segmental and somatic dysfunction of cervical region: Secondary | ICD-10-CM | POA: Diagnosis not present

## 2020-01-04 DIAGNOSIS — M9903 Segmental and somatic dysfunction of lumbar region: Secondary | ICD-10-CM | POA: Diagnosis not present

## 2020-01-06 ENCOUNTER — Ambulatory Visit: Payer: BC Managed Care – PPO | Admitting: Family Medicine

## 2020-01-07 ENCOUNTER — Ambulatory Visit (INDEPENDENT_AMBULATORY_CARE_PROVIDER_SITE_OTHER): Payer: BC Managed Care – PPO | Admitting: Family Medicine

## 2020-01-07 ENCOUNTER — Other Ambulatory Visit: Payer: Self-pay

## 2020-01-07 VITALS — BP 133/77 | HR 79 | Temp 98.3°F | Resp 16 | Ht 62.25 in | Wt 171.0 lb

## 2020-01-07 DIAGNOSIS — M9901 Segmental and somatic dysfunction of cervical region: Secondary | ICD-10-CM | POA: Diagnosis not present

## 2020-01-07 DIAGNOSIS — J3489 Other specified disorders of nose and nasal sinuses: Secondary | ICD-10-CM

## 2020-01-07 DIAGNOSIS — M50122 Cervical disc disorder at C5-C6 level with radiculopathy: Secondary | ICD-10-CM | POA: Diagnosis not present

## 2020-01-07 DIAGNOSIS — L989 Disorder of the skin and subcutaneous tissue, unspecified: Secondary | ICD-10-CM | POA: Insufficient documentation

## 2020-01-07 DIAGNOSIS — M9905 Segmental and somatic dysfunction of pelvic region: Secondary | ICD-10-CM | POA: Diagnosis not present

## 2020-01-07 DIAGNOSIS — M9903 Segmental and somatic dysfunction of lumbar region: Secondary | ICD-10-CM | POA: Diagnosis not present

## 2020-01-07 DIAGNOSIS — E559 Vitamin D deficiency, unspecified: Secondary | ICD-10-CM

## 2020-01-07 LAB — VITAMIN D 25 HYDROXY (VIT D DEFICIENCY, FRACTURES): VITD: 60.63 ng/mL (ref 30.00–100.00)

## 2020-01-07 MED ORDER — DOXYCYCLINE HYCLATE 100 MG PO TABS: 100.0000 mg | ORAL_TABLET | Freq: Two times a day (BID) | ORAL | 0 refills | Status: DC

## 2020-01-07 MED ORDER — MUPIROCIN 2 % EX OINT: 1.0000 "application " | TOPICAL_OINTMENT | Freq: Two times a day (BID) | CUTANEOUS | 0 refills | Status: DC

## 2020-01-07 NOTE — Progress Notes (Signed)
This visit occurred during the SARS-CoV-2 public health emergency.  Safety protocols were in place, including screening questions prior to the visit, additional usage of staff PPE, and extensive cleaning of exam room while observing appropriate contact time as indicated for disinfecting solutions.    Sarah Velazquez, Sarah Velazquez 11/30/1960, 59 y.o., female MRN: [id number] Patient Care Team    Relationship Specialty Notifications Start End  Ma Hillock, DO PCP - General Family Medicine  08/03/15   Pyrtle, Lajuan Lines, MD Consulting Physician Gastroenterology  08/21/17     Chief Complaint  Patient presents with  . Epistaxis     Subjective: Pt presents for an OV with complaints of changing skin lesion, vit d def and nasal ulcerations.    Skin lesion of face Patient reports skin lesion on the right side of her face.  The lesion started out as a small "age spot "many years ago.  However about 8 weeks ago it became significantly red, raised and enlarged a great deal.  She states it is less irritated but it has remained enlarged.   Vitamin D deficiency Patient has been supplementing with 1000 units of vitamin D and would like her vitamin D levels rechecked today.  Last checked 02/2019 with a level of 26.  Nasal vestibulitis Patient reports having scabs, bloody nose and foul smell in her nose since Covid in December.  She states it is present every morning she will blow her nose and crusty bloody lesions will be expelled from her nose.  She has tried nasal saline, different types of nasal sprays which seem to improve her symptoms but never completely resolved and as soon as she stopped using it returns.  She states the nose is very sore on the inside and the lining.  She admits sometimes she does pick at the source with a Q-tip.     Depression screen [facility] 2/9 12/08/2018 10/31/2017 08/21/2017 08/20/2016  Decreased Interest 1 0 0 0  Down, Depressed, Hopeless 1 0 0 0  PHQ - 2 Score 2 0 0 0  Altered sleeping 0 - - -    Tired, decreased energy 0 - - -  Change in appetite 0 - - -  Feeling bad or failure about yourself  0 - - -  Trouble concentrating 0 - - -  Moving slowly or fidgety/restless 0 - - -  Suicidal thoughts 0 - - -  PHQ-9 Score 2 - - -  Difficult doing work/chores Somewhat difficult - - -    Allergies  Allergen Reactions  . Sulfa Antibiotics    Social History   Social History Narrative   Married to [place]. Has 3 children Jaci [place], [place], [place])   Bachelors of science, homemaker.   Drinks caffeinated beverages, uses herbal remedies, takes a daily vitamin.   Wears her seatbelt, bicycle helmet and exercise is greater than 3 times a week.   Patient eats vegetarian meals 5 days a week, minimal meat product.   Smoke detector in the home, firearms in the home.   Feels safe in her relationships.   Past Medical History:  Diagnosis Date  . Allergy    seasonal, dogs, cats, horses, hay  . Arthritis    OA knees   . Asthma   . Bell's palsy in high school   right  . Cervical cancer screening 04/08/2012  . Chicken pox as a child  . Heart murmur    as child  . History of neutropenia    age  5-18, received gamma globulin monthly; with multiple hosp admisson for illness (per pt)  . Hyperglycemia   . Hyperlipidemia   . Hyperplastic colon polyp 04/09/2012  . Lipoma    growing slowly since 20s  . Menopause 2007  . Migraines   . Mumps as a child  . Shingles    right cheek  . Thrombocytosis ([place]) 04/09/2012  . Vitamin D deficiency    Past Surgical History:  Procedure Laterality Date  . CESAREAN SECTION  05-10-87  . COLONOSCOPY    . cyst removed     twice on back and 1 on foot  . DILATION AND CURETTAGE OF UTERUS  1989   incomplete miscarriage with second pregnancy and again post menopausal  . INJECTION KNEE Bilateral 09/29/15   Gel  . POLYPECTOMY    . TONSILLECTOMY  as a child   Family History  Problem Relation Age of Onset  . Stroke Mother        X 3  . Heart attack Mother    . Diabetes Mother        type 2  . Anxiety disorder Mother   . Hypertension Mother   . Hyperlipidemia Mother   . Heart disease Mother   . Cancer Mother        breast, skin  . Colon polyps Mother   . Breast cancer Mother   . Stroke Father        several  . Heart attack Father        several  . Kidney disease Father        on dialysis  . Hypertension Father   . Hyperlipidemia Father   . Cancer Father        mouth X 2/ chewed tobacco, and skin  . Anxiety disorder Father   . Colon polyps Father   . Squamous cell carcinoma Father   . Hypertension Brother   . Hyperlipidemia Brother   . Depression Brother   . Cancer Maternal Grandmother        breast  . Diabetes Maternal Grandmother        type 2  . Obesity Maternal Grandmother   . Breast cancer Maternal Grandmother   . Cancer Paternal Grandmother        lung/ didn't smoke  . Heart attack Paternal Grandfather   . Lung cancer Paternal Grandfather   . Colon cancer Neg Hx   . Rectal cancer Neg Hx   . Stomach cancer Neg Hx    Allergies as of 01/07/2020      Reactions   Sulfa Antibiotics       Medication List       Accurate as of January 07, 2020 10:13 AM. If you have any questions, ask your nurse or doctor.        STOP taking these medications   ALEVE PO Stopped by: Howard Pouch, DO   Vitamin D (Ergocalciferol) 1.25 MG (50000 UNIT) Caps capsule Commonly known as: DRISDOL Stopped by: Howard Pouch, DO     TAKE these medications   doxycycline 100 MG tablet Commonly known as: VIBRA-TABS Take 1 tablet (100 mg total) by mouth 2 (two) times daily. Started by: Howard Pouch, DO   Fish Oil 1000 MG Caps Take by mouth.   mupirocin ointment 2 % Commonly known as: Bactroban Apply 1 application topically 2 (two) times daily. apply thin layer BID for 7 days. Started by: Howard Pouch, DO   rosuvastatin 10 MG tablet Commonly known as: CRESTOR TAKE  1 TABLET DAILY   TYLENOL ARTHRITIS PAIN PO Take 1 tablet by mouth as  needed.   Vitamin B-12 500 MCG Subl Place under the tongue. Spray daily   Vitamin D (Cholecalciferol) 25 MCG (1000 UT) Tabs Take 1 tablet by mouth daily.       All past medical history, surgical history, allergies, family history, immunizations andmedications were updated in the EMR today and reviewed under the history and medication portions of their EMR.     ROS: Negative, with the exception of above mentioned in HPI   Objective:  BP 133/77 (BP Location: Left Arm, Patient Position: Sitting, Cuff Size: Small)   Pulse 79   Temp 98.3 F (36.8 C) (Oral)   Resp 16   Ht 5' 2.25" (1.581 m)   Wt 171 lb (77.6 kg)   SpO2 96%   BMI 31.03 kg/m  Body mass index is 31.03 kg/m. Gen: Afebrile. No acute distress. Nontoxic in appearance, well developed, well nourished.  HENT: AT. [place]. Bilateral nares visualized with red irritated mucous membranes of bilateral nares.  Thin they are green puslike drainage present bilateral nares. Eyes:Pupils Equal Round Reactive to light, Extraocular movements intact,  Conjunctiva without redness, discharge or icterus. Neck/lymp/endocrine: Supple,no lymphadenopathy Skin: 1.4 cm x 1.2 cm slightly raised hyperpigmented lesion just lateral lateral canthus of left eye, No purpura or petechiae.    No exam data present No results found. No results found for this or any previous visit (from the past 24 hour(s)).  Assessment/Plan: Dannie Woolen is a 59 y.o. female present for OV for  Skin lesion of face Rapid growth and inflamed lesion face. - Ambulatory referral to Dermatology  Vitamin D deficiency Supplementing with 1000u daily- last level 02/2019 26 - Vitamin D (25 hydroxy)  Nasal vestibulitis Suspect nasal vestibulitis. Will start treatment with doxycycline twice daily Patient was encouraged to purchase Nettie pot and cleanse out nasal passages once a day-must properly clean/sanitize Nettie pot between uses. Bactroban nasal ointment to be applied in a very  thin layer twice daily.   Reviewed expectations re: course of current medical issues.  Discussed self-management of symptoms.  Outlined signs and symptoms indicating need for more acute intervention.  Patient verbalized understanding and all questions were answered.  Patient received an After-Visit Summary.    Orders Placed This Encounter  Procedures  . Vitamin D (25 hydroxy)  . Ambulatory referral to Dermatology   Meds ordered this encounter  Medications  . doxycycline (VIBRA-TABS) 100 MG tablet    Sig: Take 1 tablet (100 mg total) by mouth 2 (two) times daily.    Dispense:  20 tablet    Refill:  0  . mupirocin ointment (BACTROBAN) 2 %    Sig: Apply 1 application topically 2 (two) times daily. apply thin layer BID for 7 days.    Dispense:  22 g    Refill:  0    Referral Orders     Ambulatory referral to Dermatology   Note is dictated utilizing voice recognition software. Although note has been proof read prior to signing, occasional typographical errors still can be missed. If any questions arise, please do not hesitate to call for verification.   electronically signed by:  Howard Pouch, DO  [place]

## 2020-01-07 NOTE — Patient Instructions (Signed)
Doxycycline every 12 hours for 10 days.  Use ointment 2x a day for 7 days.   If not improved or if worsening follow up in 2 weeks.

## 2020-01-14 DIAGNOSIS — M9905 Segmental and somatic dysfunction of pelvic region: Secondary | ICD-10-CM | POA: Diagnosis not present

## 2020-01-14 DIAGNOSIS — M50122 Cervical disc disorder at C5-C6 level with radiculopathy: Secondary | ICD-10-CM | POA: Diagnosis not present

## 2020-01-14 DIAGNOSIS — M9903 Segmental and somatic dysfunction of lumbar region: Secondary | ICD-10-CM | POA: Diagnosis not present

## 2020-01-14 DIAGNOSIS — M9901 Segmental and somatic dysfunction of cervical region: Secondary | ICD-10-CM | POA: Diagnosis not present

## 2020-02-04 DIAGNOSIS — Z96653 Presence of artificial knee joint, bilateral: Secondary | ICD-10-CM | POA: Diagnosis not present

## 2020-02-04 DIAGNOSIS — Z471 Aftercare following joint replacement surgery: Secondary | ICD-10-CM | POA: Diagnosis not present

## 2020-02-10 DIAGNOSIS — L821 Other seborrheic keratosis: Secondary | ICD-10-CM | POA: Diagnosis not present

## 2020-02-10 DIAGNOSIS — L814 Other melanin hyperpigmentation: Secondary | ICD-10-CM | POA: Diagnosis not present

## 2020-02-10 DIAGNOSIS — D225 Melanocytic nevi of trunk: Secondary | ICD-10-CM | POA: Diagnosis not present

## 2020-02-10 DIAGNOSIS — D1801 Hemangioma of skin and subcutaneous tissue: Secondary | ICD-10-CM | POA: Diagnosis not present

## 2020-02-21 ENCOUNTER — Other Ambulatory Visit: Payer: Self-pay | Admitting: Family Medicine

## 2020-03-21 DIAGNOSIS — M9901 Segmental and somatic dysfunction of cervical region: Secondary | ICD-10-CM | POA: Diagnosis not present

## 2020-03-21 DIAGNOSIS — M9903 Segmental and somatic dysfunction of lumbar region: Secondary | ICD-10-CM | POA: Diagnosis not present

## 2020-03-21 DIAGNOSIS — M50122 Cervical disc disorder at C5-C6 level with radiculopathy: Secondary | ICD-10-CM | POA: Diagnosis not present

## 2020-03-21 DIAGNOSIS — M9905 Segmental and somatic dysfunction of pelvic region: Secondary | ICD-10-CM | POA: Diagnosis not present

## 2020-03-23 DIAGNOSIS — M9905 Segmental and somatic dysfunction of pelvic region: Secondary | ICD-10-CM | POA: Diagnosis not present

## 2020-03-23 DIAGNOSIS — M50122 Cervical disc disorder at C5-C6 level with radiculopathy: Secondary | ICD-10-CM | POA: Diagnosis not present

## 2020-03-23 DIAGNOSIS — M9901 Segmental and somatic dysfunction of cervical region: Secondary | ICD-10-CM | POA: Diagnosis not present

## 2020-03-23 DIAGNOSIS — M9903 Segmental and somatic dysfunction of lumbar region: Secondary | ICD-10-CM | POA: Diagnosis not present

## 2020-03-24 ENCOUNTER — Other Ambulatory Visit: Payer: Self-pay | Admitting: Family Medicine

## 2020-03-28 DIAGNOSIS — M50122 Cervical disc disorder at C5-C6 level with radiculopathy: Secondary | ICD-10-CM | POA: Diagnosis not present

## 2020-03-28 DIAGNOSIS — M9905 Segmental and somatic dysfunction of pelvic region: Secondary | ICD-10-CM | POA: Diagnosis not present

## 2020-03-28 DIAGNOSIS — M9903 Segmental and somatic dysfunction of lumbar region: Secondary | ICD-10-CM | POA: Diagnosis not present

## 2020-03-28 DIAGNOSIS — M9901 Segmental and somatic dysfunction of cervical region: Secondary | ICD-10-CM | POA: Diagnosis not present

## 2020-04-13 ENCOUNTER — Other Ambulatory Visit: Payer: Self-pay | Admitting: Family Medicine

## 2020-04-13 NOTE — Telephone Encounter (Signed)
Patient has moved out of town. She has not found a new PCP yet and would like a refill of her Crestor. Her new pharmacy is [facility] [address], [place], [place].

## 2020-04-14 MED ORDER — ROSUVASTATIN CALCIUM 10 MG PO TABS: 10.0000 mg | ORAL_TABLET | Freq: Every day | ORAL | 1 refills | Status: AC

## 2020-04-14 NOTE — Addendum Note (Signed)
Addended by: Howard Pouch A on: 04/14/2020 09:32 AM   Modules accepted: Orders

## 2020-04-14 NOTE — Telephone Encounter (Signed)
I refilled her crestor for a total of 90 days with 1 refill. Please advise pt she will need to find a new pcp asap, as no additional refills can be granted for her since she is no longer established here.   Hope she is doing well.   Removed [place] as PCP since she has moved

## 2020-04-14 NOTE — Telephone Encounter (Signed)
30 day has already been given to pt. Please advise.

## 2020-04-14 NOTE — Telephone Encounter (Signed)
Pt aware of rx and instructions

## 2020-06-13 DIAGNOSIS — E538 Deficiency of other specified B group vitamins: Secondary | ICD-10-CM | POA: Diagnosis not present

## 2020-06-13 DIAGNOSIS — E782 Mixed hyperlipidemia: Secondary | ICD-10-CM | POA: Diagnosis not present

## 2020-06-13 DIAGNOSIS — M810 Age-related osteoporosis without current pathological fracture: Secondary | ICD-10-CM | POA: Diagnosis not present

## 2020-06-13 DIAGNOSIS — E559 Vitamin D deficiency, unspecified: Secondary | ICD-10-CM | POA: Diagnosis not present

## 2020-08-04 DIAGNOSIS — E538 Deficiency of other specified B group vitamins: Secondary | ICD-10-CM | POA: Diagnosis not present

## 2020-08-04 DIAGNOSIS — E782 Mixed hyperlipidemia: Secondary | ICD-10-CM | POA: Diagnosis not present

## 2020-08-04 DIAGNOSIS — E559 Vitamin D deficiency, unspecified: Secondary | ICD-10-CM | POA: Diagnosis not present

## 2020-08-04 DIAGNOSIS — M170 Bilateral primary osteoarthritis of knee: Secondary | ICD-10-CM | POA: Diagnosis not present

## 2020-08-08 DIAGNOSIS — M810 Age-related osteoporosis without current pathological fracture: Secondary | ICD-10-CM | POA: Diagnosis not present

## 2020-08-08 DIAGNOSIS — Z1231 Encounter for screening mammogram for malignant neoplasm of breast: Secondary | ICD-10-CM | POA: Diagnosis not present

## 2020-08-11 DIAGNOSIS — Z0000 Encounter for general adult medical examination without abnormal findings: Secondary | ICD-10-CM | POA: Diagnosis not present

## 2020-08-11 DIAGNOSIS — N952 Postmenopausal atrophic vaginitis: Secondary | ICD-10-CM | POA: Diagnosis not present

## 2020-08-11 DIAGNOSIS — E782 Mixed hyperlipidemia: Secondary | ICD-10-CM | POA: Diagnosis not present

## 2020-08-11 DIAGNOSIS — Z6830 Body mass index (BMI) 30.0-30.9, adult: Secondary | ICD-10-CM | POA: Diagnosis not present

## 2020-08-15 DIAGNOSIS — Z23 Encounter for immunization: Secondary | ICD-10-CM | POA: Diagnosis not present

## 2022-07-12 ENCOUNTER — Encounter: Payer: Self-pay | Admitting: Internal Medicine
